# Patient Record
Sex: Female | Born: 1975 | Race: Black or African American | Hispanic: No | Marital: Married | Smoking: Never smoker
Health system: Southern US, Community
[De-identification: ages and names within clinical notes are randomized; demographics above are authoritative.]

## PROBLEM LIST (undated history)

## (undated) DIAGNOSIS — M2550 Pain in unspecified joint: Secondary | ICD-10-CM

## (undated) DIAGNOSIS — R609 Edema, unspecified: Secondary | ICD-10-CM

## (undated) DIAGNOSIS — R0602 Shortness of breath: Secondary | ICD-10-CM

## (undated) DIAGNOSIS — E669 Obesity, unspecified: Secondary | ICD-10-CM

## (undated) DIAGNOSIS — I82409 Acute embolism and thrombosis of unspecified deep veins of unspecified lower extremity: Secondary | ICD-10-CM

## (undated) DIAGNOSIS — R7303 Prediabetes: Secondary | ICD-10-CM

## (undated) DIAGNOSIS — M1990 Unspecified osteoarthritis, unspecified site: Secondary | ICD-10-CM

## (undated) HISTORY — PX: TONSILLECTOMY: SUR1361

## (undated) HISTORY — DX: Shortness of breath: R06.02

## (undated) HISTORY — DX: Edema, unspecified: R60.9

## (undated) HISTORY — DX: Obesity, unspecified: E66.9

## (undated) HISTORY — DX: Prediabetes: R73.03

## (undated) HISTORY — DX: Pain in unspecified joint: M25.50

## (undated) HISTORY — DX: Unspecified osteoarthritis, unspecified site: M19.90

---

## 2006-03-17 ENCOUNTER — Inpatient Hospital Stay (HOSPITAL_COMMUNITY): Admission: AD | Admit: 2006-03-17 | Discharge: 2006-03-17 | Payer: Self-pay | Admitting: Obstetrics and Gynecology

## 2006-03-20 ENCOUNTER — Inpatient Hospital Stay (HOSPITAL_COMMUNITY): Admission: AD | Admit: 2006-03-20 | Discharge: 2006-03-23 | Payer: Self-pay | Admitting: Obstetrics and Gynecology

## 2006-03-24 ENCOUNTER — Encounter: Admission: RE | Admit: 2006-03-24 | Discharge: 2006-04-08 | Payer: Self-pay | Admitting: Obstetrics and Gynecology

## 2006-04-03 ENCOUNTER — Inpatient Hospital Stay (HOSPITAL_COMMUNITY): Admission: AD | Admit: 2006-04-03 | Discharge: 2006-04-03 | Payer: Self-pay | Admitting: Obstetrics and Gynecology

## 2011-08-13 ENCOUNTER — Other Ambulatory Visit (HOSPITAL_COMMUNITY): Payer: BC Managed Care – PPO

## 2011-08-15 ENCOUNTER — Ambulatory Visit (HOSPITAL_COMMUNITY)
Admission: RE | Admit: 2011-08-15 | Payer: BC Managed Care – PPO | Source: Ambulatory Visit | Admitting: Orthopaedic Surgery

## 2011-10-31 ENCOUNTER — Ambulatory Visit
Admission: RE | Admit: 2011-10-31 | Discharge: 2011-10-31 | Disposition: A | Discharge status: Home / Self Care | Payer: BC Managed Care – PPO | Source: Ambulatory Visit | Attending: Sports Medicine | Admitting: Sports Medicine

## 2011-10-31 ENCOUNTER — Other Ambulatory Visit: Payer: Self-pay | Admitting: Sports Medicine

## 2011-10-31 DIAGNOSIS — S8253XA Displaced fracture of medial malleolus of unspecified tibia, initial encounter for closed fracture: Secondary | ICD-10-CM

## 2012-06-22 ENCOUNTER — Ambulatory Visit: Payer: BC Managed Care – PPO | Admitting: Family Medicine

## 2014-01-18 ENCOUNTER — Ambulatory Visit
Admission: RE | Admit: 2014-01-18 | Discharge: 2014-01-18 | Disposition: A | Discharge status: Home / Self Care | Payer: BC Managed Care – PPO | Source: Ambulatory Visit | Attending: Family Medicine | Admitting: Family Medicine

## 2014-01-18 ENCOUNTER — Other Ambulatory Visit: Payer: Self-pay | Admitting: Family Medicine

## 2014-01-18 DIAGNOSIS — M79609 Pain in unspecified limb: Secondary | ICD-10-CM

## 2014-05-20 ENCOUNTER — Other Ambulatory Visit (HOSPITAL_COMMUNITY): Payer: Self-pay | Admitting: Family Medicine

## 2014-05-20 ENCOUNTER — Ambulatory Visit (HOSPITAL_COMMUNITY)
Admission: RE | Admit: 2014-05-20 | Discharge: 2014-05-20 | Disposition: A | Discharge status: Home / Self Care | Payer: BC Managed Care – PPO | Source: Ambulatory Visit | Attending: Family Medicine | Admitting: Family Medicine

## 2014-05-20 DIAGNOSIS — M7989 Other specified soft tissue disorders: Secondary | ICD-10-CM

## 2014-05-20 DIAGNOSIS — M79609 Pain in unspecified limb: Secondary | ICD-10-CM

## 2014-05-20 DIAGNOSIS — M25569 Pain in unspecified knee: Secondary | ICD-10-CM

## 2014-05-20 NOTE — Progress Notes (Signed)
Bilateral lower extremity venous duplex completed.  Right:  No evidence of DVT, superficial thrombosis, or Baker's cyst.  Left: DVT noted in the posterior tibial vein.  No evidence of superficial thrombosis.  No Baker's cyst.   

## 2014-07-18 ENCOUNTER — Encounter: Payer: BC Managed Care – PPO | Attending: Family Medicine | Admitting: Dietician

## 2014-07-18 ENCOUNTER — Encounter: Payer: Self-pay | Admitting: Dietician

## 2014-07-18 DIAGNOSIS — Z6841 Body Mass Index (BMI) 40.0 and over, adult: Secondary | ICD-10-CM | POA: Diagnosis not present

## 2014-07-18 DIAGNOSIS — Z713 Dietary counseling and surveillance: Secondary | ICD-10-CM | POA: Diagnosis not present

## 2014-07-18 NOTE — Patient Instructions (Addendum)
-  Fill up on non-starchy vegetables (any veggie that's not corn, peas, or potatoes)  -Veggies are good raw or cooked, fresh or frozen  -Mindless Eating by Lynnell GrainBrian Wansink  -Make healthy choices at fast food restaurants (refer to handouts)  -Avoid skipping meals  -Keep healthy snacks at work: cheese sticks, fruit, raw veggies with hummus, nuts, granola bars, popcorn, boiled eggs, tortilla chips  -Pre portion snacks in a baggie, bowl, napkin  -Increase physical activity  -Zumba, water aerobics, chair exercises   -Goal: 30 minutes 3x a week  -Vitamins A, D, E, K are fat-soluble -Vitamins B and C are water-soluble

## 2014-07-18 NOTE — Progress Notes (Signed)
  Medical Nutrition Therapy:  Appt start time: 1530 end time:  1630.   Assessment:  Primary concerns today: Catherine Armstrong states that she has always been heavy and it is really starting to slow her down. She says her weight is limiting her quality of life. A blood clot was recently found in her leg. Otherwise she has no comorbidities. However, she has a strong family history of diabetes and stroke. Catherine Armstrong reports she has tried [employer] "on and off" for the last 5-6 years. Also has tried Nut[employer]ystem and "cutting back" on her own. She lives with her 39-year old son and husband. 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 She reports she has been having a lot of fast food recently. A few years ago she went through the bariatric process but cancelled surgery.  Preferred Learning Style:   No preference indicated   Learning Readiness:   Ready   MEDICATIONS: xarelto   DIETARY INTAKE:  Avoided foods include green beans and cooked carrots and cooked broccoli.    24-hr recall:  B ( AM): Whey protein shake with skim or 2% milk  Snk ( AM):   L ( PM): sometimes skips; fast food Snk ( PM): Doritos, peanuts, or [employer]ess snacks D ( PM): Hibachi grill, Barbaritos, or [employer] Snk ( PM):   Beverages: water; soda rarely; sweet tea occasionally  Usual physical activity: none recently  Estimated energy needs: 1800 calories  Progress Towards Goal(s):  In progress.   Nutritional Diagnosis:  [place]-3.3 Overweight/obesity As related to sedentary lifestyle, inappropriate food choices, and excessive energy intake.  As evidenced by BMI 68.    Intervention:  Nutrition counseling provided. Encouraged weight loss of 1-2 lbs per week.  Teaching Method Utilized:  Visual Auditory Hands on  Handouts given during visit include:  Fast Food Options  Bariatric fast food guide  15g CHO + protein snacks  Peds Fast food  handout  MyPlate  Armchair exercises  Barriers to learning/adherence to lifestyle change: schedule  Demonstrated degree of understanding via:  Teach Back   Monitoring/Evaluation:  Dietary intake, exercise, and body weight in 6 week(s).

## 2014-08-30 ENCOUNTER — Ambulatory Visit: Payer: BC Managed Care – PPO | Admitting: Dietician

## 2014-12-30 DIAGNOSIS — I82409 Acute embolism and thrombosis of unspecified deep veins of unspecified lower extremity: Secondary | ICD-10-CM

## 2014-12-30 HISTORY — DX: Acute embolism and thrombosis of unspecified deep veins of unspecified lower extremity: I82.409

## 2016-10-24 DIAGNOSIS — Z139 Encounter for screening, unspecified: Secondary | ICD-10-CM | POA: Diagnosis not present

## 2016-10-24 DIAGNOSIS — H9209 Otalgia, unspecified ear: Secondary | ICD-10-CM | POA: Diagnosis not present

## 2017-09-11 DIAGNOSIS — J069 Acute upper respiratory infection, unspecified: Secondary | ICD-10-CM | POA: Diagnosis not present

## 2017-09-11 DIAGNOSIS — H6693 Otitis media, unspecified, bilateral: Secondary | ICD-10-CM | POA: Diagnosis not present

## 2017-10-29 DIAGNOSIS — Z23 Encounter for immunization: Secondary | ICD-10-CM | POA: Diagnosis not present

## 2017-10-29 DIAGNOSIS — L988 Other specified disorders of the skin and subcutaneous tissue: Secondary | ICD-10-CM | POA: Diagnosis not present

## 2017-10-30 ENCOUNTER — Encounter: Payer: BLUE CROSS/BLUE SHIELD | Attending: Surgery | Admitting: Surgery

## 2017-10-30 DIAGNOSIS — L02221 Furuncle of abdominal wall: Secondary | ICD-10-CM | POA: Insufficient documentation

## 2017-10-30 DIAGNOSIS — M1990 Unspecified osteoarthritis, unspecified site: Secondary | ICD-10-CM | POA: Insufficient documentation

## 2017-10-30 DIAGNOSIS — X58XXXA Exposure to other specified factors, initial encounter: Secondary | ICD-10-CM | POA: Insufficient documentation

## 2017-10-30 DIAGNOSIS — S31104A Unspecified open wound of abdominal wall, left lower quadrant without penetration into peritoneal cavity, initial encounter: Secondary | ICD-10-CM | POA: Diagnosis not present

## 2017-10-30 DIAGNOSIS — Z86718 Personal history of other venous thrombosis and embolism: Secondary | ICD-10-CM | POA: Insufficient documentation

## 2017-10-30 DIAGNOSIS — Z6841 Body Mass Index (BMI) 40.0 and over, adult: Secondary | ICD-10-CM | POA: Insufficient documentation

## 2017-10-30 DIAGNOSIS — L98492 Non-pressure chronic ulcer of skin of other sites with fat layer exposed: Secondary | ICD-10-CM | POA: Diagnosis not present

## 2017-11-01 NOTE — Progress Notes (Signed)
SHAYLEN, NEPHEW M. ([id number]) Visit Report for 10/30/2017 Chief Complaint Document Details Patient Name: Catherine Armstrong, Catherine Armstrong. Date of Service: 10/30/2017 8:15 AM Medical Record Number: [id number] Patient Account Number: [id number] Date of Birth/Sex: 11/16/1976 (41 y.o. Female) Treating RN: Curtis Sites Primary Care Provider: WHITE, CYNTHIA Other Clinician: Referring Provider: WHITE, CYNTHIA Treating Provider/Extender: Rudene Re in Treatment: 0 Information Obtained from: Patient Chief Complaint Patient seen for complaints of Non-Healing Wound to her lower abdomen wall which has been there for about a month Electronic Signature(s) Signed: 10/30/2017 9:03:37 AM By: Evlyn Kanner MD, FACS Entered By: Evlyn Kanner on 10/30/2017 09:03:37 Ailene Ravel. ([id number]) -------------------------------------------------------------------------------- HPI Details Patient Name: Catherine Armstrong. Date of Service: 10/30/2017 8:15 AM Medical Record Number: [id number] Patient Account Number: [id number] Date of Birth/Sex: 07-04-1976 (41 y.o. Female) Treating RN: Curtis Sites Primary Care Provider: WHITE, CYNTHIA Other Clinician: Referring Provider: WHITE, CYNTHIA Treating Provider/Extender: Rudene Re in Treatment: 0 History of Present Illness Location: left lower abdominal wall on her pannus Quality: Patient reports experiencing a dull pain to affected area(s). Severity: Patient states wound are getting worse. Duration: Patient has had the wound for < 4 weeks prior to presenting for treatment Timing: Pain in wound is Intermittent (comes and goes Context: The wound occurred when the patient had what sounds like a for uncle which opened out and drained some pus Modifying Factors: Other treatment(s) tried include:local care with Neosporin ointment Associated Signs and Symptoms: Patient reports having increase discharge. HPI Description: 41 year old morbidly obese patient who has a  history of getting furuncles every now and then under her arms, possibly had a furuncle on her anterior abdomen wall in the region of a large pannus on the left lower quadrant. This drained some fluid or pus and the patient thinks since then it has been frustrating due to not being able to offload it. other than that she has not had any major issues like diabetes and is not a smoker. Electronic Signature(s) Signed: 10/30/2017 9:05:52 AM By: Evlyn Kanner MD, FACS Entered By: Evlyn Kanner on 10/30/2017 09:05:52 Ailene Ravel. ([id number]) -------------------------------------------------------------------------------- Physical Exam Details Patient Name: Catherine Armstrong. Date of Service: 10/30/2017 8:15 AM Medical Record Number: [id number] Patient Account Number: [id number] Date of Birth/Sex: 26-Jul-1976 (41 y.o. Female) Treating RN: Curtis Sites Primary Care Provider: WHITE, CYNTHIA Other Clinician: Referring Provider: WHITE, CYNTHIA Treating Provider/Extender: Rudene Re in Treatment: 0 Constitutional . Pulse regular. Respirations normal and unlabored. Afebrile. . Eyes Nonicteric. Reactive to light. Ears, Nose, Mouth, and Throat Lips, teeth, and gums WNL.Marland Kitchen Moist mucosa without lesions. Neck supple and nontender. No palpable supraclavicular or cervical adenopathy. Normal sized without goiter. Respiratory WNL. No retractions.. Cardiovascular Pedal Pulses WNL. No clubbing, cyanosis or edema. Gastrointestinal (GI) Abdomen without masses or tenderness.. No liver or spleen enlargement or tenderness.. Lymphatic No adneopathy. No adenopathy. No adenopathy. Musculoskeletal Adexa without tenderness or enlargement.. Digits and nails w/o clubbing, cyanosis, infection, petechiae, ischemia, or inflammatory conditions.. Integumentary (Hair, Skin) No suspicious lesions. No crepitus or fluctuance. No peri-wound warmth or erythema. No masses.Marland Kitchen Psychiatric Judgement and insight  Intact.. No evidence of depression, anxiety, or agitation.. Notes ulcerated area on the pannus in the left lower quadrant with significant amount of slough but no erythema or surrounding cellulitis. Very little granulation tissue. No sharp debridement was required today. Electronic Signature(s) Signed: 10/30/2017 9:06:30 AM By: Evlyn Kanner MD, FACS Entered By: Evlyn Kanner on 10/30/2017 09:06:29 Ailene Ravel. ([id number]) -------------------------------------------------------------------------------- Physician Orders Details Patient Name: Catherine Armstrong.  Date of Service: 10/30/2017 8:15 AM Medical Record Number: [id number] Patient Account Number: [id number] Date of Birth/Sex: 08/27/1976 (41 y.o. Female) Treating RN: Curtis Sites Primary Care Provider: WHITE, CYNTHIA Other Clinician: Referring Provider: WHITE, CYNTHIA Treating Provider/Extender: Rudene Re in Treatment: 0 Verbal / Phone Orders: No Diagnosis Coding Wound Cleansing Wound #1 Left Abdomen - Lower Quadrant o Clean wound with Normal Saline. o Cleanse wound with mild soap and water o May Shower, gently pat wound dry prior to applying new dressing. Anesthetic Wound #1 Left Abdomen - Lower Quadrant o Topical Lidocaine 4% cream applied to wound bed prior to debridement Skin Barriers/Peri-Wound Care Wound #1 Left Abdomen - Lower Quadrant o Skin Prep Primary Wound Dressing Wound #1 Left Abdomen - Lower Quadrant o Santyl Ointment Secondary Dressing Wound #1 Left Abdomen - Lower Quadrant o Dry Gauze o Boardered Foam Dressing - or telfa island dressing Dressing Change Frequency Wound #1 Left Abdomen - Lower Quadrant o Change dressing every day. Follow-up Appointments Wound #1 Left Abdomen - Lower Quadrant o Return Appointment in 1 week. Additional Orders / Instructions Wound #1 Left Abdomen - Lower Quadrant o Increase protein intake. o Other: - Please add vitamin A, vitamin C,  multivitamin, and zinc supplements to your diet Medications-please add to medication list. Wound #1 Left Abdomen - Lower Quadrant o Santyl Enzymatic Ointment Dunavan, Randie M. ([id number]) Patient Medications Allergies: No Known Allergies Notifications Medication Indication Start End Santyl 10/30/2017 DOSE topical 250 unit/gram ointment - ointment topical as directed Electronic Signature(s) Signed: 10/30/2017 9:12:13 AM By: Evlyn Kanner MD, FACS Entered By: Evlyn Kanner on 10/30/2017 09:12:13 Studley, [name] ([id number]) -------------------------------------------------------------------------------- Problem List Details Patient Name: CAMAURI, FLEECE. Date of Service: 10/30/2017 8:15 AM Medical Record Number: [id number] Patient Account Number: [id number] Date of Birth/Sex: 07/30/1976 (41 y.o. Female) Treating RN: Curtis Sites Primary Care Provider: WHITE, CYNTHIA Other Clinician: Referring Provider: WHITE, CYNTHIA Treating Provider/Extender: Rudene Re in Treatment: 0 Active Problems ICD-10 Encounter Code Description Active Date Diagnosis S31.104A Unspecified open wound of abdominal wall, left lower quadrant 10/30/2017 Yes without penetration into peritoneal cavity, initial encounter L02.221 Furuncle of abdominal wall 10/30/2017 Yes E66.01 Morbid (severe) obesity due to excess calories 10/30/2017 Yes Inactive Problems Resolved Problems Electronic Signature(s) Signed: 10/30/2017 9:03:12 AM By: Evlyn Kanner MD, FACS Entered By: Evlyn Kanner on 10/30/2017 09:03:11 Ailene Ravel. ([id number]) -------------------------------------------------------------------------------- Progress Note Details Patient Name: MADIE, CAHN. Date of Service: 10/30/2017 8:15 AM Medical Record Number: [id number] Patient Account Number: [id number] Date of Birth/Sex: Sep 04, 1976 (41 y.o. Female) Treating RN: Curtis Sites Primary Care Provider: WHITE, CYNTHIA Other Clinician: Referring  Provider: WHITE, CYNTHIA Treating Provider/Extender: Rudene Re in Treatment: 0 Subjective Chief Complaint Information obtained from Patient Patient seen for complaints of Non-Healing Wound to her lower abdomen wall which has been there for about a month History of Present Illness (HPI) The following HPI elements were documented for the patient's wound: Location: left lower abdominal wall on her pannus Quality: Patient reports experiencing a dull pain to affected area(s). Severity: Patient states wound are getting worse. Duration: Patient has had the wound for < 4 weeks prior to presenting for treatment Timing: Pain in wound is Intermittent (comes and goes Context: The wound occurred when the patient had what sounds like a for uncle which opened out and drained some pus Modifying Factors: Other treatment(s) tried include:local care with Neosporin ointment Associated Signs and Symptoms: Patient reports having increase discharge. 41 year old morbidly obese patient who has a history of getting furuncles  every now and then under her arms, possibly had a furuncle on her anterior abdomen wall in the region of a large pannus on the left lower quadrant. This drained some fluid or pus and the patient thinks since then it has been frustrating due to not being able to offload it. other than that she has not had any major issues like diabetes and is not a smoker. Wound History Patient presents with 1 open wound that has been present for approximately 1 month. Patient has been treating wound in the following manner: neosporin and bandaid. Laboratory tests have not been performed in the last month. Patient reportedly has not tested positive for an antibiotic resistant organism. Patient reportedly has not tested positive for osteomyelitis. Patient reportedly has not had testing performed to evaluate circulation in the legs. Patient History Information obtained from Patient. Allergies No Known  Allergies Family History Cancer - Father, Diabetes - Father,Paternal Grandparents, Kidney Disease - Father, Tuberculosis - Maternal Grandparents, No family history of Heart Disease, Hereditary Spherocytosis, Hypertension, Lung Disease, Seizures, Stroke, Thyroid Problems. Social History Never smoker, Marital Status - Married, Alcohol Use - Moderate, Drug Use - No History, Caffeine Use - Rarely. Medical History Hematologic/Lymphatic Denies history of Anemia, Hemophilia, Human Immunodeficiency Virus, Lymphedema, Sickle Cell Disease Respiratory Dahm, Alissia M. ([id number]) Denies history of Aspiration, Asthma, Chronic Obstructive Pulmonary Disease (COPD), Pneumothorax, Sleep Apnea, Tuberculosis Cardiovascular Patient has history of Deep Vein Thrombosis - 2 years ago Denies history of Angina, Arrhythmia, Congestive Heart Failure, Coronary Artery Disease, Hypertension, Hypotension, Myocardial Infarction, Peripheral Arterial Disease, Peripheral Venous Disease, Phlebitis, Vasculitis Gastrointestinal Denies history of Cirrhosis , Colitis, Crohn s, Hepatitis A, Hepatitis B, Hepatitis C Endocrine Denies history of Type I Diabetes, Type II Diabetes Genitourinary Denies history of End Stage Renal Disease Immunological Patient has history of Raynaud s Denies history of Lupus Erythematosus, Scleroderma Integumentary (Skin) Denies history of History of Burn, History of pressure wounds Musculoskeletal Patient has history of Osteoarthritis Denies history of Gout, Rheumatoid Arthritis, Osteomyelitis Oncologic Denies history of Received Chemotherapy, Received Radiation Review of Systems (ROS) Constitutional Symptoms (General Health) The patient has no complaints or symptoms. Eyes The patient has no complaints or symptoms. Ear/Nose/Mouth/Throat The patient has no complaints or symptoms. Hematologic/Lymphatic The patient has no complaints or symptoms. Respiratory The patient has no complaints  or symptoms. Cardiovascular The patient has no complaints or symptoms. Gastrointestinal The patient has no complaints or symptoms. Endocrine The patient has no complaints or symptoms. Genitourinary The patient has no complaints or symptoms. Immunological The patient has no complaints or symptoms. Integumentary (Skin) The patient has no complaints or symptoms. Musculoskeletal The patient has no complaints or symptoms. Neurologic The patient has no complaints or symptoms. Oncologic The patient has no complaints or symptoms. Psychiatric The patient has no complaints or symptoms. CADY, HAFEN M. ([id number]) Medications amoxicillin 875 mg tablet oral 1 1 tablet oral two times daily Objective Constitutional Pulse regular. Respirations normal and unlabored. Afebrile. Vitals Time Taken: 8:24 AM, Height: 69 in, Source: Measured, Weight: 499 lbs, Source: Measured, BMI: 73.7, Temperature: 98.2 F, Pulse: 104 bpm, Respiratory Rate: 18 breaths/min, Blood Pressure: 128/78 mmHg. Eyes Nonicteric. Reactive to light. Ears, Nose, Mouth, and Throat Lips, teeth, and gums WNL.Marland Kitchen Moist mucosa without lesions. Neck supple and nontender. No palpable supraclavicular or cervical adenopathy. Normal sized without goiter. Respiratory WNL. No retractions.. Cardiovascular Pedal Pulses WNL. No clubbing, cyanosis or edema. Gastrointestinal (GI) Abdomen without masses or tenderness.. No liver or spleen enlargement or tenderness.. Lymphatic No adneopathy.  No adenopathy. No adenopathy. Musculoskeletal Adexa without tenderness or enlargement.. Digits and nails w/o clubbing, cyanosis, infection, petechiae, ischemia, or inflammatory conditions.Marland Kitchen. Psychiatric Judgement and insight Intact.. No evidence of depression, anxiety, or agitation.. General Notes: ulcerated area on the pannus in the left lower quadrant with significant amount of slough but no erythema or surrounding cellulitis. Very little  granulation tissue. No sharp debridement was required today. Integumentary (Hair, Skin) No suspicious lesions. No crepitus or fluctuance. No peri-wound warmth or erythema. No masses.. Wound #1 status is Open. Original cause of wound was Shear/Friction. The wound is located on the Left Abdomen - Lower Quadrant. The wound measures 1.3cm length x 2cm width x 0.2cm depth; 2.042cm^2 area and 0.408cm^3 volume. There is a large amount of purulent drainage noted. The wound margin is flat and intact. There is medium (34-66%) pink granulation within the wound bed. There is a medium (34-66%) amount of necrotic tissue within the wound bed. The periwound skin appearance exhibited: Induration, Maceration. Periwound temperature was noted as No Abnormality. Steward RosURKETT, Sanora M. ([id number]) Assessment Active Problems ICD-10 S31.104A - Unspecified open wound of abdominal wall, left lower quadrant without penetration into peritoneal cavity, initial encounter L02.221 - Furuncle of abdominal wall E66.01 - Morbid (severe) obesity due to excess calories 41 year old morbidly obese patient who possibly had a furuncle on her left lower anterior abdominal wall which has been chronically nonhealing for about a month now, needs chemical debridement and local care and after review have recommended: 1. Santyl ointment locally to be applied daily after washing with soap and water. 2. offloading this area as well as possible 3. Regular was asked to the wound center She has had all questions answered and will be seen back in about 10 days as she is traveling Plan Wound Cleansing: Wound #1 Left Abdomen - Lower Quadrant: Clean wound with Normal Saline. Cleanse wound with mild soap and water May Shower, gently pat wound dry prior to applying new dressing. Anesthetic: Wound #1 Left Abdomen - Lower Quadrant: Topical Lidocaine 4% cream applied to wound bed prior to debridement Skin Barriers/Peri-Wound Care: Wound #1 Left  Abdomen - Lower Quadrant: Skin Prep Primary Wound Dressing: Wound #1 Left Abdomen - Lower Quadrant: Santyl Ointment Secondary Dressing: Wound #1 Left Abdomen - Lower Quadrant: Dry Gauze Boardered Foam Dressing - or telfa island dressing Dressing Change Frequency: Wound #1 Left Abdomen - Lower Quadrant: Change dressing every day. Follow-up Appointments: Wound #1 Left Abdomen - Lower Quadrant: Return Appointment in 1 week. Additional Orders / Instructions: Wound #1 Left Abdomen - Lower Quadrant: Increase protein intake. Other: - Please add vitamin A, vitamin C, multivitamin, and zinc supplements to your diet Scalisi, Consetta M. ([id number]) Medications-please add to medication list.: Wound #1 Left Abdomen - Lower Quadrant: Santyl Enzymatic Ointment The following medication(s) was prescribed: Santyl topical 250 unit/gram ointment ointment topical as directed starting 10/30/2017 41 year old morbidly obese patient who possibly had a furuncle on her left lower anterior abdominal wall which has been chronically nonhealing for about a month now, needs chemical debridement and local care and after review have recommended: 1. Santyl ointment locally to be applied daily after washing with soap and water. 2. offloading this area as well as possible 3. Regular was asked to the wound center She has had all questions answered and will be seen back in about 10 days as she is traveling Psychologist, prison and probation serviceslect[facility] Signature(s) Signed: 10/30/2017 9:12:34 AM By: Evlyn KannerBritto, Kippy Gohman MD, FACS Previous Signature: 10/30/2017 9:11:20 AM Version By: Evlyn KannerBritto, Nizar Cutler MD, FACS Entered  By: Evlyn Kanner on 10/30/2017 09:12:34 Yanik, [name] ([id number]) -------------------------------------------------------------------------------- ROS/PFSH Details Patient Name: EVELYNN, HENCH. Date of Service: 10/30/2017 8:15 AM Medical Record Number: [id number] Patient Account Number: [id number] Date of Birth/Sex: Mar 26, 1976 (41 y.o.  Female) Treating RN: Curtis Sites Primary Care Provider: WHITE, CYNTHIA Other Clinician: Referring Provider: WHITE, CYNTHIA Treating Provider/Extender: Rudene Re in Treatment: 0 Information Obtained From Patient Wound History Do you currently have one or more open woundso Yes How many open wounds do you currently haveo 1 Approximately how long have you had your woundso 1 month How have you been treating your wound(s) until nowo neosporin and bandaid Has your wound(s) ever healed and then re-openedo No Have you had any lab work done in the past montho No Have you tested positive for an antibiotic resistant organism (MRSA, VRE)o No Have you tested positive for osteomyelitis (bone infection)o No Have you had any tests for circulation on your legso No Constitutional Symptoms (General Health) Complaints and Symptoms: No Complaints or Symptoms Eyes Complaints and Symptoms: No Complaints or Symptoms Ear/Nose/Mouth/Throat Complaints and Symptoms: No Complaints or Symptoms Hematologic/Lymphatic Complaints and Symptoms: No Complaints or Symptoms Medical History: Negative for: Anemia; Hemophilia; Human Immunodeficiency Virus; Lymphedema; Sickle Cell Disease Respiratory Complaints and Symptoms: No Complaints or Symptoms Medical History: Negative for: Aspiration; Asthma; Chronic Obstructive Pulmonary Disease (COPD); Pneumothorax; Sleep Apnea; Tuberculosis Cardiovascular Complaints and Symptoms: No Complaints or Symptoms Zollner, Christelle M. ([id number]) Medical History: Positive for: Deep Vein Thrombosis - 2 years ago Negative for: Angina; Arrhythmia; Congestive Heart Failure; Coronary Artery Disease; Hypertension; Hypotension; Myocardial Infarction; Peripheral Arterial Disease; Peripheral Venous Disease; Phlebitis; Vasculitis Gastrointestinal Complaints and Symptoms: No Complaints or Symptoms Medical History: Negative for: Cirrhosis ; Colitis; Crohnos; Hepatitis A;  Hepatitis B; Hepatitis C Endocrine Complaints and Symptoms: No Complaints or Symptoms Medical History: Negative for: Type I Diabetes; Type II Diabetes Genitourinary Complaints and Symptoms: No Complaints or Symptoms Medical History: Negative for: End Stage Renal Disease Immunological Complaints and Symptoms: No Complaints or Symptoms Medical History: Positive for: Raynaudos Negative for: Lupus Erythematosus; Scleroderma Integumentary (Skin) Complaints and Symptoms: No Complaints or Symptoms Medical History: Negative for: History of Burn; History of pressure wounds Musculoskeletal Complaints and Symptoms: No Complaints or Symptoms Medical History: Positive for: Osteoarthritis Negative for: Gout; Rheumatoid Arthritis; Osteomyelitis Neurologic Complaints and Symptoms: No Complaints or Symptoms Register, Jayliani M. ([id number]) Oncologic Complaints and Symptoms: No Complaints or Symptoms Medical History: Negative for: Received Chemotherapy; Received Radiation Psychiatric Complaints and Symptoms: No Complaints or Symptoms Immunizations Pneumococcal Vaccine: Received Pneumococcal Vaccination: No Tetanus Vaccine: Last tetanus shot: 10/29/2017 Immunization Notes: up to date Implantable Devices Family and Social History Cancer: Yes - Father; Diabetes: Yes - Father,Paternal Grandparents; Heart Disease: No; Hereditary Spherocytosis: No; Hypertension: No; Kidney Disease: Yes - Father; Lung Disease: No; Seizures: No; Stroke: No; Thyroid Problems: No; Tuberculosis: Yes - Maternal Grandparents; Never smoker; Marital Status - Married; Alcohol Use: Moderate; Drug Use: No History; Caffeine Use: Rarely; Financial Concerns: No; Food, Clothing or Shelter Needs: No; Support System Lacking: No; Transportation Concerns: No; Advanced Directives: No; Patient does not want information on Advanced Directives Physician Affirmation I have reviewed and agree with the above  information. Electronic Signature(s) Signed: 10/30/2017 4:34:23 PM By: Evlyn Kanner MD, FACS Signed: 10/30/2017 4:49:35 PM By: Curtis Sites Entered By: Evlyn Kanner on 10/30/2017 09:01:35 Ailene Ravel. ([id number]) -------------------------------------------------------------------------------- SuperBill Details Patient Name: ELIORA, NIENHUIS. Date of Service: 10/30/2017 Medical Record Number: [id number] Patient Account Number: [id number] Date of Birth/Sex: 12/25/1976 (41 y.o.  Female) Treating RN: Curtis Sites Primary Care Provider: WHITE, CYNTHIA Other Clinician: Referring Provider: WHITE, CYNTHIA Treating Provider/Extender: Rudene Re in Treatment: 0 Diagnosis Coding ICD-10 Codes Code Description Unspecified open wound of abdominal wall, left lower quadrant without penetration into peritoneal cavity, S31.104A initial encounter L02.221 Furuncle of abdominal wall E66.01 Morbid (severe) obesity due to excess calories Facility Procedures CPT4 Code: 16109604 Description: 99214 - WOUND CARE VISIT-LEV 4 EST PT Modifier: Quantity: 1 Physician Procedures CPT4: Description Modifier Quantity Code 5409811 99204 - WC PHYS LEVEL 4 - NEW PT 1 ICD-10 Diagnosis Description S31.104A Unspecified open wound of abdominal wall, left lower quadrant without penetration into peritoneal cavity, initial encounter L02.221  Furuncle of abdominal wall E66.01 Morbid (severe) obesity due to excess calories Electronic Signature(s) Signed: 10/30/2017 9:20:48 AM By: Curtis Sites Signed: 10/30/2017 4:34:23 PM By: Evlyn Kanner MD, FACS Previous Signature: 10/30/2017 9:13:00 AM Version By: Evlyn Kanner MD, FACS Entered By: Curtis Sites on 10/30/2017 09:20:47

## 2017-11-01 NOTE — Progress Notes (Signed)
Steward RosURKETT, Malaysha M. ([id number]) Visit Report for 10/30/2017 Abuse/Suicide Risk Screen Details Patient Name: Catherine Armstrong, [name] Date of Service: 10/30/2017 8:15 AM Medical Record Number: [id number] Patient Account Number: [id number] Date of Birth/Sex: 1976/04/07 (41 y.o. Female) Treating RN: Curtis Sitesorthy, Joanna Primary Care Bucky Grigg: WHITE, CYNTHIA Other Clinician: Referring Yechiel Erny: WHITE, CYNTHIA Treating Santiaga Butzin/Extender: [name], Errol Weeks in Treatment: 0 Abuse/Suicide Risk Screen Items Answer ABUSE/SUICIDE RISK SCREEN: Has anyone close to you tried to hurt or harm you recentlyo No Do you feel uncomfortable with anyone in your familyo No Has anyone forced you do things that you didnot want to doo No Do you have any thoughts of harming yourselfo No Patient displays signs or symptoms of abuse and/or neglect. No Electronic Signature(s) Signed: 10/30/2017 4:49:35 PM By: Curtis Sitesorthy, Joanna Entered By: Curtis Sitesorthy, Joanna on 10/30/2017 08:22:44 Catherine Armstrong, [name] ([id number]) -------------------------------------------------------------------------------- Activities of Daily Living Details Patient Name: Catherine Armstrong, [name] Date of Service: 10/30/2017 8:15 AM Medical Record Number: [id number] Patient Account Number: [id number] Date of Birth/Sex: 1976/04/07 (41 y.o. Female) Treating RN: Curtis Sitesorthy, Joanna Primary Care Latiana Tomei: WHITE, CYNTHIA Other Clinician: Referring Aidan Moten: WHITE, CYNTHIA Treating Hymie Gorr/Extender: [name], Errol Weeks in Treatment: 0 Activities of Daily Living Items Answer Activities of Daily Living (Please select one for each item) Drive Automobile Completely Able Take Medications Completely Able Use Telephone Completely Able Care for Appearance Completely Able Use Toilet Completely Able Bath / Shower Completely Able Dress Self Completely Able Feed Self Completely Able Walk Completely Able Get In / Out Bed Completely Able Housework Completely Able Prepare Meals Completely  Able Handle Money Completely Able Shop for Self Completely Able Electronic Signature(s) Signed: 10/30/2017 4:49:35 PM By: Curtis Sitesorthy, Joanna Entered By: Curtis Sitesorthy, Joanna on 10/30/2017 08:23:02 Catherine Armstrong, [name] ([id number]) -------------------------------------------------------------------------------- Education Assessment Details Patient Name: Catherine Armstrong, [name] Date of Service: 10/30/2017 8:15 AM Medical Record Number: [id number] Patient Account Number: [id number] Date of Birth/Sex: 1976/04/07 (41 y.o. Female) Treating RN: Curtis Sitesorthy, Joanna Primary Care Yetta Marceaux: WHITE, CYNTHIA Other Clinician: Referring Ceclia Koker: WHITE, CYNTHIA Treating Hanaa Payes/Extender: [name], Errol Weeks in Treatment: 0 Primary Learner Assessed: Patient Learning Preferences/Education Level/Primary Language Learning Preference: Explanation, Demonstration, Printed Material Highest Education Level: College or Above Preferred Language: English Cognitive Barrier Assessment/Beliefs Language Barrier: No Translator Needed: No Memory Deficit: No Emotional Barrier: No Cultural/Religious Beliefs Affecting Medical Care: No Physical Barrier Assessment Impaired Vision: No Impaired Hearing: No Decreased Hand dexterity: No Knowledge/Comprehension Assessment Knowledge Level: Medium Comprehension Level: Medium Ability to understand written Medium instructions: Ability to understand verbal Medium instructions: Motivation Assessment Anxiety Level: Calm Cooperation: Cooperative Education Importance: Acknowledges Need Interest in Health Problems: Asks Questions Perception: Coherent Willingness to Engage in Self- Medium Management Activities: Readiness to Engage in Self- Medium Management Activities: Electronic Signature(s) Signed: 10/30/2017 4:49:35 PM By: Curtis Sitesorthy, Joanna Entered By: Curtis Sitesorthy, Joanna on 10/30/2017 08:23:51 [name], Catherine BaptistNIKKI M.  ([id number]) -------------------------------------------------------------------------------- Fall Risk Assessment Details Patient Name: Catherine Armstrong, [name] Date of Service: 10/30/2017 8:15 AM Medical Record Number: [id number] Patient Account Number: [id number] Date of Birth/Sex: 1976/04/07 (41 y.o. Female) Treating RN: Curtis Sitesorthy, Joanna Primary Care Marykate Heuberger: WHITE, CYNTHIA Other Clinician: Referring Zakariyah Freimark: WHITE, CYNTHIA Treating Ahmarion Saraceno/Extender: [name], Errol Weeks in Treatment: 0 Fall Risk Assessment Items Have you had 2 or more falls in the last 12 monthso 0 No Have you had any fall that resulted in injury in the last 12 monthso 0 No FALL RISK ASSESSMENT: History of falling - immediate or within 3 months 0 No Secondary diagnosis 0 No Ambulatory aid None/bed rest/wheelchair/nurse 0 Yes Crutches/cane/walker 0 No Furniture 0 No  IV Access/Saline Lock 0 No Gait/Training Normal/bed rest/immobile 0 Yes Weak 0 No Impaired 0 No Mental Status Oriented to own ability 0 Yes Electronic Signature(s) Signed: 10/30/2017 4:49:35 PM By: Curtis Sites Entered By: Curtis Sites on 10/30/2017 08:24:13 Mowrer, Catherine Armstrong. ([id number]) -------------------------------------------------------------------------------- Nutrition Risk Assessment Details Patient Name: Catherine Armstrong, [name] Date of Service: 10/30/2017 8:15 AM Medical Record Number: [id number] Patient Account Number: [id number] Date of Birth/Sex: 1976/04/02 (41 y.o. Female) Treating RN: Curtis Sites Primary Care Lexia Vandevender: WHITE, CYNTHIA Other Clinician: Referring Abimbola Aki: WHITE, CYNTHIA Treating Reshard Guillet/Extender: Rudene Re in Treatment: 0 Height (in): Weight (lbs): Body Mass Index (BMI): Nutrition Risk Assessment Items NUTRITION RISK SCREEN: I have an illness or condition that made me change the kind and/or amount of 0 No food I eat I eat fewer than two meals per day 0 No I eat few fruits and vegetables, or milk products  0 No I have three or more drinks of beer, liquor or wine almost every day 0 No I have tooth or mouth problems that make it hard for me to eat 0 No I don't always have enough money to buy the food I need 0 No I eat alone most of the time 0 No I take three or more different prescribed or over-the-counter drugs a day 0 No Without wanting to, I have lost or gained 10 pounds in the last six months 0 No I am not always physically able to shop, cook and/or feed myself 0 No Nutrition Protocols Good Risk Protocol 0 No interventions needed Moderate Risk Protocol Electronic Signature(s) Signed: 10/30/2017 4:49:35 PM By: Curtis Sites Entered By: Curtis Sites on 10/30/2017 08:24:41

## 2017-11-03 NOTE — Progress Notes (Signed)
COLA, GANE M. ([id number]) Visit Report for 10/30/2017 Allergy List Details Patient Name: Catherine Armstrong, Catherine Armstrong. Date of Service: 10/30/2017 8:15 AM Medical Record Number: [id number] Patient Account Number: [id number] Date of Birth/Sex: June 19, 1976 (41 y.o. Female) Treating RN: Curtis Sites Primary Care Cheyne Boulden: WHITE, CYNTHIA Other Clinician: Referring Jayel Scaduto: WHITE, CYNTHIA Treating Jaxson Keener/Extender: Rudene Re in Treatment: 0 Allergies Active Allergies No Known Allergies Allergy Notes Electronic Signature(s) Signed: 10/30/2017 4:49:35 PM By: Curtis Sites Entered By: Curtis Sites on 10/30/2017 08:25:41 Margulies, [name] ([id number]) -------------------------------------------------------------------------------- Arrival Information Details Patient Name: Catherine Armstrong. Date of Service: 10/30/2017 8:15 AM Medical Record Number: [id number] Patient Account Number: [id number] Date of Birth/Sex: Mar 01, 1976 (42 y.o. Female) Treating RN: Curtis Sites Primary Care Alyus Mofield: WHITE, CYNTHIA Other Clinician: Referring Januel Doolan: WHITE, CYNTHIA Treating Joyce Leckey/Extender: Rudene Re in Treatment: 0 Visit Information Patient Arrived: Ambulatory Arrival Time: 08:21 Accompanied By: self Transfer Assistance: None Patient Identification Verified: Yes Secondary Verification Process Completed: Yes Electronic Signature(s) Signed: 10/30/2017 4:49:35 PM By: Curtis Sites Entered By: Curtis Sites on 10/30/2017 08:22:17 Ailene Ravel. ([id number]) -------------------------------------------------------------------------------- Clinic Level of Care Assessment Details Patient Name: Catherine Armstrong. Date of Service: 10/30/2017 8:15 AM Medical Record Number: [id number] Patient Account Number: [id number] Date of Birth/Sex: 1976-11-24 (41 y.o. Female) Treating RN: Curtis Sites Primary Care Mickey Hebel: WHITE, CYNTHIA Other Clinician: Referring Jaquane Boughner: WHITE,  CYNTHIA Treating Yasira Engelson/Extender: Rudene Re in Treatment: 0 Clinic Level of Care Assessment Items TOOL 2 Quantity Score []  - Use when only an EandM is performed on the INITIAL visit 0 ASSESSMENTS - Nursing Assessment / Reassessment X - General Physical Exam (combine w/ comprehensive assessment (listed just below) when 1 20 performed on new pt. evals) X- 1 25 Comprehensive Assessment (HX, ROS, Risk Assessments, Wounds Hx, etc.) ASSESSMENTS - Wound and Skin Assessment / Reassessment X - Simple Wound Assessment / Reassessment - one wound 1 5 []  - 0 Complex Wound Assessment / Reassessment - multiple wounds []  - 0 Dermatologic / Skin Assessment (not related to wound area) ASSESSMENTS - Ostomy and/or Continence Assessment and Care []  - Incontinence Assessment and Management 0 []  - 0 Ostomy Care Assessment and Management (repouching, etc.) PROCESS - Coordination of Care X - Simple Patient / Family Education for ongoing care 1 15 []  - 0 Complex (extensive) Patient / Family Education for ongoing care X- 1 10 Staff obtains Chiropractor, Records, Test Results / Process Orders []  - 0 Staff telephones HHA, Nursing Homes / Clarify orders / etc []  - 0 Routine Transfer to another Facility (non-emergent condition) []  - 0 Routine Hospital Admission (non-emergent condition) X- 1 15 New Admissions / Manufacturing engineer / Ordering NPWT, Apligraf, etc. []  - 0 Emergency Hospital Admission (emergent condition) X- 1 10 Simple Discharge Coordination []  - 0 Complex (extensive) Discharge Coordination PROCESS - Special Needs []  - Pediatric / Minor Patient Management 0 []  - 0 Isolation Patient Management Obrecht, Tiombe M. ([id number]) []  - 0 Hearing / Language / Visual special needs []  - 0 Assessment of Community assistance (transportation, D/C planning, etc.) []  - 0 Additional assistance / Altered mentation []  - 0 Support Surface(s) Assessment (bed, cushion, seat,  etc.) INTERVENTIONS - Wound Cleansing / Measurement X - Wound Imaging (photographs - any number of wounds) 1 5 []  - 0 Wound Tracing (instead of photographs) X- 1 5 Simple Wound Measurement - one wound []  - 0 Complex Wound Measurement - multiple wounds X- 1 5 Simple Wound Cleansing - one wound []  - 0 Complex Wound Cleansing - multiple  wounds INTERVENTIONS - Wound Dressings X - Small Wound Dressing one or multiple wounds 1 10 []  - 0 Medium Wound Dressing one or multiple wounds []  - 0 Large Wound Dressing one or multiple wounds []  - 0 Application of Medications - injection INTERVENTIONS - Miscellaneous []  - External ear exam 0 []  - 0 Specimen Collection (cultures, biopsies, blood, body fluids, etc.) []  - 0 Specimen(s) / Culture(s) sent or taken to Lab for analysis []  - 0 Patient Transfer (multiple staff / Nurse, adult / Similar devices) []  - 0 Simple Staple / Suture removal (25 or less) []  - 0 Complex Staple / Suture removal (26 or more) []  - 0 Hypo / Hyperglycemic Management (close monitor of Blood Glucose) []  - 0 Ankle / Brachial Index (ABI) - do not check if billed separately Has the patient been seen at the hospital within the last three years: Yes Total Score: 125 Level Of Care: New/Established - Level 4 Electronic Signature(s) Signed: 10/30/2017 4:49:35 PM By: Curtis Sites Entered By: Curtis Sites on 10/30/2017 09:20:38 Ailene Ravel. ([id number]) -------------------------------------------------------------------------------- Encounter Discharge Information Details Patient Name: Catherine Armstrong. Date of Service: 10/30/2017 8:15 AM Medical Record Number: [id number] Patient Account Number: [id number] Date of Birth/Sex: [birth year] (41 y.o. Female) Treating RN: Curtis Sites Primary Care Mileigh Tilley: WHITE, CYNTHIA Other Clinician: Referring Mithra Spano: WHITE, CYNTHIA Treating Cire Clute/Extender: Rudene Re in Treatment: 0 Encounter Discharge Information  Items Discharge Pain Level: 0 Discharge Condition: Stable Ambulatory Status: Ambulatory Discharge Destination: Home Transportation: Private Auto Accompanied By: self Schedule Follow-up Appointment: Yes Medication Reconciliation completed and No provided to Patient/Care Tavian Callander: Provided on Clinical Summary of Care: 10/30/2017 Form Type Recipient Paper Patient NP Electronic Signature(s) Signed: 11/03/2017 10:06:49 AM By: Gwenlyn Perking Entered By: Gwenlyn Perking on 10/30/2017 09:05:43 Nikolic, Lowella Bandy M. ([id number]) -------------------------------------------------------------------------------- Lower Extremity Assessment Details Patient Name: ANJANI, FEUERBORN. Date of Service: 10/30/2017 8:15 AM Medical Record Number: [id number] Patient Account Number: [id number] Date of Birth/Sex: [birth year] (41 y.o. Female) Treating RN: Curtis Sites Primary Care Harvir Patry: WHITE, CYNTHIA Other Clinician: Referring Jedidiah Demartini: WHITE, CYNTHIA Treating Nickholas Goldston/Extender: Rudene Re in Treatment: 0 Electronic Signature(s) Signed: 10/30/2017 4:49:35 PM By: Curtis Sites Entered By: Curtis Sites on 10/30/2017 08:38:22 Ailene Ravel. ([id number]) -------------------------------------------------------------------------------- Multi Wound Chart Details Patient Name: MATILDA, FLEIG. Date of Service: 10/30/2017 8:15 AM Medical Record Number: [id number] Patient Account Number: [id number] Date of Birth/Sex: Jul 18, 1976 (41 y.o. Female) Treating RN: Curtis Sites Primary Care Tafari Humiston: WHITE, CYNTHIA Other Clinician: Referring Generoso Cropper: WHITE, CYNTHIA Treating Rowyn Spilde/Extender: Rudene Re in Treatment: 0 Vital Signs Height(in): 69 Pulse(bpm): 104 Weight(lbs): 499 Blood Pressure(mmHg): 128/78 Body Mass Index(BMI): 74 Temperature(F): 98.2 Respiratory Rate 18 (breaths/min): Photos: [1:No Photos] [N/A:N/A] Wound Location: [1:Left Abdomen - Lower Quadrant] [N/A:N/A] Wounding  Event: [1:Shear/Friction] [N/A:N/A] Primary Etiology: [1:Pressure Ulcer] [N/A:N/A] Comorbid History: [1:Deep Vein Thrombosis, Raynaudos, Osteoarthritis] [N/A:N/A] Date Acquired: [1:09/29/2017] [N/A:N/A] Weeks of Treatment: [1:0] [N/A:N/A] Wound Status: [1:Open] [N/A:N/A] Measurements L x W x D [1:1.3x2x0.2] [N/A:N/A] (cm) Area (cm) : [1:2.042] [N/A:N/A] Volume (cm) : [1:0.408] [N/A:N/A] Classification: [1:Category/Stage III] [N/A:N/A] Exudate Amount: [1:Large] [N/A:N/A] Exudate Type: [1:Purulent] [N/A:N/A] Exudate Color: [1:yellow, brown, green] [N/A:N/A] Wound Margin: [1:Flat and Intact] [N/A:N/A] Granulation Amount: [1:Medium (34-66%)] [N/A:N/A] Granulation Quality: [1:Pink] [N/A:N/A] Necrotic Amount: [1:Medium (34-66%)] [N/A:N/A] Periwound Skin Texture: [1:Induration: Yes] [N/A:N/A] Periwound Skin Moisture: [1:Maceration: Yes] [N/A:N/A] Periwound Skin Color: [1:No Abnormalities Noted] [N/A:N/A] Temperature: [1:No Abnormality] [N/A:N/A] Tenderness on Palpation: [1:No] [N/A:N/A] Wound Preparation: [1:Ulcer Cleansing: Rinsed/Irrigated with Saline] [N/A:N/A] Topical Anesthetic Applied: Other: lidocaine 4% Treatment Notes  Electronic Signature(s) Standage, Lowella BandyNIKKI M. ([id number]) Signed: 10/30/2017 9:03:16 AM By: Evlyn KannerBritto, Errol MD, FACS Entered By: Evlyn KannerBritto, Errol on 10/30/2017 09:03:16 Ailene RavelPURKETT, Batool M. ([id number]) -------------------------------------------------------------------------------- Multi-Disciplinary Care Plan Details Patient Name: Ailene RavelURKETT, Keisha M. Date of Service: 10/30/2017 8:15 AM Medical Record Number: [id number] Patient Account Number: [id number] Date of Birth/Sex: [birth year] (41 y.o. Female) Treating RN: Curtis Sitesorthy, Joanna Primary Care Acelin Ferdig: WHITE, CYNTHIA Other Clinician: Referring Jettson Crable: WHITE, CYNTHIA Treating Melaya Hoselton/Extender: [name], Errol Weeks in Treatment: 0 Active Inactive ` Orientation to the Wound Care Program Nursing Diagnoses: Knowledge  deficit related to the wound healing center program Goals: Patient/caregiver will verbalize understanding of the Wound Healing Center Program Date Initiated: 10/30/2017 Target Resolution Date: 01/23/2018 Goal Status: Active Interventions: Provide education on orientation to the wound center Notes: ` Pressure Nursing Diagnoses: Potential for impaired tissue integrity related to pressure, friction, moisture, and shear Goals: Patient will remain free from development of additional pressure ulcers Date Initiated: 10/30/2017 Target Resolution Date: 02/06/2018 Goal Status: Active Interventions: Provide education on pressure ulcers Notes: ` Wound/Skin Impairment Nursing Diagnoses: Impaired tissue integrity Goals: Ulcer/skin breakdown will heal within 14 weeks Date Initiated: 10/30/2017 Target Resolution Date: 02/07/2018 Goal Status: Active Interventions: Ailene RavelURKETT, Bryanda M. ([id number]) Assess patient/caregiver ability to obtain necessary supplies Assess patient/caregiver ability to perform ulcer/skin care regimen upon admission and as needed Assess ulceration(s) every visit Notes: Electronic Signature(s) Signed: 10/30/2017 4:49:35 PM By: Curtis Sitesorthy, Joanna Entered By: Curtis Sitesorthy, Joanna on 10/30/2017 08:49:17 Scarantino, [name] ([id number]) -------------------------------------------------------------------------------- Pain Assessment Details Patient Name: Ailene RavelURKETT, Lalisa M. Date of Service: 10/30/2017 8:15 AM Medical Record Number: [id number] Patient Account Number: [id number] Date of Birth/Sex: [birth year] (41 y.o. Female) Treating RN: Curtis Sitesorthy, Joanna Primary Care Yazmyn Valbuena: WHITE, CYNTHIA Other Clinician: Referring Karrisa Didio: WHITE, CYNTHIA Treating Ocie Tino/Extender: [name], Errol Weeks in Treatment: 0 Active Problems Location of Pain Severity and Description of Pain Patient Has Paino No Site Locations Pain Management and Medication Current Pain Management: Electronic Signature(s) Signed:  10/30/2017 4:49:35 PM By: Curtis Sitesorthy, Joanna Entered By: Curtis Sitesorthy, Joanna on 10/30/2017 08:22:29 Ailene RavelPURKETT, Shahrzad M. ([id number]) -------------------------------------------------------------------------------- Patient/Caregiver Education Details Patient Name: Ailene RavelURKETT, Makyna M. Date of Service: 10/30/2017 8:15 AM Medical Record Number: [id number] Patient Account Number: [id number] Date of Birth/Gender: [birth year] (41 y.o. Female) Treating RN: Curtis Sitesorthy, Joanna Primary Care Physician: WHITE, CYNTHIA Other Clinician: Referring Physician: WHITE, CYNTHIA Treating Physician/Extender: [name], Errol Weeks in Treatment: 0 Education Assessment Education Provided To: Patient Education Topics Provided Wound/Skin Impairment: Handouts: Other: wound care as ordered Methods: Demonstration, Explain/Verbal Responses: State content correctly Electronic Signature(s) Signed: 10/30/2017 4:49:35 PM By: Curtis Sitesorthy, Joanna Entered By: Curtis Sitesorthy, Joanna on 10/30/2017 08:52:19 Tener, [name] ([id number]) -------------------------------------------------------------------------------- Wound Assessment Details Patient Name: Ailene RavelURKETT, Orianna M. Date of Service: 10/30/2017 8:15 AM Medical Record Number: [id number] Patient Account Number: [id number] Date of Birth/Sex: [birth year] (41 y.o. Female) Treating RN: Curtis Sitesorthy, Joanna Primary Care Asharia Lotter: WHITE, CYNTHIA Other Clinician: Referring Ezrah Dembeck: WHITE, CYNTHIA Treating Camrie Stock/Extender: [name], Errol Weeks in Treatment: 0 Wound Status Wound Number: 1 Primary Pressure Ulcer Etiology: Wound Location: Left Abdomen - Lower Quadrant Wound Status: Open Wounding Event: Shear/Friction Comorbid Deep Vein Thrombosis, Raynaudos, Date Acquired: 09/29/2017 History: Osteoarthritis Weeks Of Treatment: 0 Clustered Wound: No Photos Photo Uploaded By: Curtis Sitesorthy, Joanna on 10/30/2017 10:29:56 Wound Measurements Length: (cm) Width: (cm) Depth: (cm) Area: (cm) Volume: (cm) 1.3 %  Reduction in Area: 2 % Reduction in Volume: 0.2 2.042 0.408 Wound Description Classification: Category/Stage III Wound Margin: Flat and Intact Exudate Amount: Large Exudate Type: Purulent Exudate Color: yellow, brown, green Foul Odor After Cleansing: No Slough/Fibrino  Yes Wound Bed Granulation Amount: Medium (34-66%) Granulation Quality: Pink Necrotic Amount: Medium (34-66%) Periwound Skin Texture Texture Color No Abnormalities Noted: No No Abnormalities Noted: No Induration: Yes Temperature / Pain Moisture Temperature: No Abnormality Brun, [place] M. ([id number]) No Abnormalities Noted: No Maceration: Yes Wound Preparation Ulcer Cleansing: Rinsed/Irrigated with Saline Topical Anesthetic Applied: Other: lidocaine 4%, Treatment Notes Wound #1 (Left Abdomen - Lower Quadrant) 1. Cleansed with: Clean wound with Normal Saline 2. Anesthetic Topical Lidocaine 4% cream to wound bed prior to debridement 4. Dressing Applied: Santyl Ointment 5. Secondary Dressing Applied Bordered Foam Dressing Dry Gauze Electronic Signature(s) Signed: 10/30/2017 4:49:35 PM By: Curtis Sites Entered By: Curtis Sites on 10/30/2017 08:38:10 Gibbons, [name] ([id number]) -------------------------------------------------------------------------------- Vitals Details Patient Name: SHANTAE, VANTOL. Date of Service: 10/30/2017 8:15 AM Medical Record Number: [id number] Patient Account Number: [id number] Date of Birth/Sex: Oct 07, 1976 (41 y.o. Female) Treating RN: Curtis Sites Primary Care Necola Bluestein: WHITE, CYNTHIA Other Clinician: Referring Pascuala Klutts: WHITE, CYNTHIA Treating Kaelum Kissick/Extender: Rudene Re in Treatment: 0 Vital Signs Time Taken: 08:24 Temperature (F): 98.2 Height (in): 69 Pulse (bpm): 104 Source: Measured Respiratory Rate (breaths/min): 18 Weight (lbs): 499 Blood Pressure (mmHg): 128/78 Source: Measured Reference Range: 80 - 120 mg / dl Body Mass Index (BMI):  73.7 Electronic Signature(s) Signed: 10/30/2017 4:49:35 PM By: Curtis Sites Entered By: Curtis Sites on 10/30/2017 08:25:21

## 2017-11-10 ENCOUNTER — Ambulatory Visit: Payer: BLUE CROSS/BLUE SHIELD | Admitting: Surgery

## 2017-11-14 ENCOUNTER — Encounter: Payer: BLUE CROSS/BLUE SHIELD | Admitting: Surgery

## 2017-11-14 DIAGNOSIS — S31104A Unspecified open wound of abdominal wall, left lower quadrant without penetration into peritoneal cavity, initial encounter: Secondary | ICD-10-CM | POA: Diagnosis not present

## 2017-11-14 DIAGNOSIS — X58XXXA Exposure to other specified factors, initial encounter: Secondary | ICD-10-CM | POA: Diagnosis not present

## 2017-11-14 DIAGNOSIS — Z86718 Personal history of other venous thrombosis and embolism: Secondary | ICD-10-CM | POA: Diagnosis not present

## 2017-11-14 DIAGNOSIS — Z6841 Body Mass Index (BMI) 40.0 and over, adult: Secondary | ICD-10-CM | POA: Diagnosis not present

## 2017-11-14 DIAGNOSIS — M1990 Unspecified osteoarthritis, unspecified site: Secondary | ICD-10-CM | POA: Diagnosis not present

## 2017-11-14 DIAGNOSIS — L02221 Furuncle of abdominal wall: Secondary | ICD-10-CM | POA: Diagnosis not present

## 2017-11-16 NOTE — Progress Notes (Addendum)
Steward RosURKETT, [name] ([id number]) Visit Report for 11/14/2017 Arrival Information Details Patient Name: Catherine Armstrong, [name] Date of Service: 11/14/2017 9:15 AM Medical Record Number: [id number] Patient Account Number: [id number] Date of Birth/Sex: February 27, 1976 (41 y.o. Female) Treating RN: Catherine Armstrong Primary Care Catherine Armstrong: Catherine Armstrong Other Clinician: Referring Catherine Armstrong: Catherine Armstrong Treating Frederico Gerling/Extender: Catherine Armstrong in Treatment: 2 Visit Information History Since Last Visit Added or deleted any medications: No Patient Arrived: Ambulatory Any new allergies or adverse reactions: No Arrival Time: 09:21 Had a fall or experienced change in No Accompanied By: self activities of daily living that may affect Transfer Assistance: None risk of falls: Patient Identification Verified: Yes Signs or symptoms of abuse/neglect since last visito No Secondary Verification Process Completed: Yes Hospitalized since last visit: No Has Dressing in Place as Prescribed: Yes Pain Present Now: No Electronic Signature(s) Signed: 11/14/2017 5:02:13 PM By: Catherine Armstrong Entered By: Catherine Armstrong on 11/14/2017 09:22:12 Catherine Armstrong, [name] ([id number]) -------------------------------------------------------------------------------- Clinic Level of Care Assessment Details Patient Name: Catherine Armstrong, [name] Date of Service: 11/14/2017 9:15 AM Medical Record Number: [id number] Patient Account Number: [id number] Date of Birth/Sex: February 27, 1976 (41 y.o. Female) Treating RN: Catherine Armstrong Primary Care Karuna Balducci: Catherine Armstrong Other Clinician: Referring Casha Estupinan: Catherine Armstrong Treating Catherine Armstrong/Extender: Catherine Armstrong in Treatment: 2 Clinic Level of Care Assessment Items TOOL 4 Quantity Score []  - Use when only an EandM is performed on FOLLOW-UP visit 0 ASSESSMENTS - Nursing Assessment / Reassessment X - Reassessment of Co-morbidities (includes updates in patient status) 1 10 X- 1  5 Reassessment of Adherence to Treatment Plan ASSESSMENTS - Wound and Skin Assessment / Reassessment X - Simple Wound Assessment / Reassessment - one wound 1 5 []  - 0 Complex Wound Assessment / Reassessment - multiple wounds []  - 0 Dermatologic / Skin Assessment (not related to wound area) ASSESSMENTS - Focused Assessment []  - Circumferential Edema Measurements - multi extremities 0 []  - 0 Nutritional Assessment / Counseling / Intervention []  - 0 Lower Extremity Assessment (monofilament, tuning fork, pulses) []  - 0 Peripheral Arterial Disease Assessment (using hand held doppler) ASSESSMENTS - Ostomy and/or Continence Assessment and Care []  - Incontinence Assessment and Management 0 []  - 0 Ostomy Care Assessment and Management (repouching, etc.) PROCESS - Coordination of Care X - Simple Patient / Family Education for ongoing care 1 15 []  - 0 Complex (extensive) Patient / Family Education for ongoing care []  - 0 Staff obtains ChiropractorConsents, Records, Test Results / Process Orders []  - 0 Staff telephones HHA, Nursing Homes / Clarify orders / etc []  - 0 Routine Transfer to another Facility (non-emergent condition) []  - 0 Routine Hospital Admission (non-emergent condition) []  - 0 New Admissions / Manufacturing engineernsurance Authorizations / Ordering NPWT, Apligraf, etc. []  - 0 Emergency Hospital Admission (emergent condition) X- 1 10 Simple Discharge Coordination [name], [name] ([id number]) []  - 0 Complex (extensive) Discharge Coordination PROCESS - Special Needs []  - Pediatric / Minor Patient Management 0 []  - 0 Isolation Patient Management []  - 0 Hearing / Language / Visual special needs []  - 0 Assessment of Community assistance (transportation, D/C planning, etc.) []  - 0 Additional assistance / Altered mentation []  - 0 Support Surface(s) Assessment (bed, cushion, seat, etc.) INTERVENTIONS - Wound Cleansing / Measurement X - Simple Wound Cleansing - one wound 1 5 []  - 0 Complex Wound  Cleansing - multiple wounds X- 1 5 Wound Imaging (photographs - any number of wounds) []  - 0 Wound Tracing (instead of photographs) X- 1 5 Simple Wound Measurement -  one wound []  - 0 Complex Wound Measurement - multiple wounds INTERVENTIONS - Wound Dressings X - Small Wound Dressing one or multiple wounds 1 10 []  - 0 Medium Wound Dressing one or multiple wounds []  - 0 Large Wound Dressing one or multiple wounds []  - 0 Application of Medications - topical []  - 0 Application of Medications - injection INTERVENTIONS - Miscellaneous []  - External ear exam 0 []  - 0 Specimen Collection (cultures, biopsies, blood, body fluids, etc.) []  - 0 Specimen(s) / Culture(s) sent or taken to Lab for analysis []  - 0 Patient Transfer (multiple staff / Nurse, adult / Similar devices) []  - 0 Simple Staple / Suture removal (25 or less) []  - 0 Complex Staple / Suture removal (26 or more) []  - 0 Hypo / Hyperglycemic Management (close monitor of Blood Glucose) []  - 0 Ankle / Brachial Index (ABI) - do not check if billed separately X- 1 5 Vital Signs [name], [name] ([id number]) Has the patient been seen at the hospital within the last three years: Yes Total Score: 75 Level Of Care: New/Established - Level 2 Electronic Signature(s) Signed: 11/14/2017 5:02:13 PM By: Catherine Sites Entered By: Catherine Sites on 11/14/2017 10:43:13 [name], Catherine Baptist. ([id number]) -------------------------------------------------------------------------------- Encounter Discharge Information Details Patient Name: Catherine Armstrong, Catherine Armstrong. Date of Service: 11/14/2017 9:15 AM Medical Record Number: [id number] Patient Account Number: [id number] Date of Birth/Sex: 1976/05/03 (41 y.o. Female) Treating RN: Catherine Sites Primary Care Brittish Bolinger: Catherine Armstrong Other Clinician: Referring Cillian Gwinner: Catherine Armstrong Treating Asia Favata/Extender: Catherine Re in Treatment: 2 Encounter Discharge Information Items Discharge Pain  Level: 0 Discharge Condition: Stable Ambulatory Status: Ambulatory Discharge Destination: Home Transportation: Private Auto Accompanied By: self Schedule Follow-up Appointment: Yes Medication Reconciliation completed and No provided to Patient/Care Katelin Kutsch: Provided on Clinical Summary of Care: 11/14/2017 Form Type Recipient Paper Patient NP Electronic Signature(s) Signed: 11/14/2017 5:02:13 PM By: Catherine Sites Entered By: Catherine Sites on 11/14/2017 10:44:07 Gabay, Catherine Baptist. ([id number]) -------------------------------------------------------------------------------- Multi Wound Chart Details Patient Name: TOYOKO, SILOS. Date of Service: 11/14/2017 9:15 AM Medical Record Number: [id number] Patient Account Number: [id number] Date of Birth/Sex: [birth year] (41 y.o. Female) Treating RN: Catherine Sites Primary Care Elijah Michaelis: Catherine Armstrong Other Clinician: Referring Dorissa Stinnette: Catherine Armstrong Treating Tylar Amborn/Extender: Catherine Re in Treatment: 2 Vital Signs Height(in): 69 Pulse(bpm): 97 Weight(lbs): 499 Blood Pressure(mmHg): 152/82 Body Mass Index(BMI): 74 Temperature(F): 98.4 Respiratory Rate 18 (breaths/min): Photos: [1:No Photos] [N/A:N/A] Wound Location: [1:Left Abdomen - Lower Quadrant] [N/A:N/A] Wounding Event: [1:Shear/Friction] [N/A:N/A] Primary Etiology: [1:Pressure Ulcer] [N/A:N/A] Comorbid History: [1:Deep Vein Thrombosis, Raynaudos, Osteoarthritis] [N/A:N/A] Date Acquired: [1:09/29/2017] [N/A:N/A] Armstrong of Treatment: [1:2] [N/A:N/A] Wound Status: [1:Open] [N/A:N/A] Measurements L x W x D [1:0.2x0.2x0.1] [N/A:N/A] (cm) Area (cm) : [1:0.031] [N/A:N/A] Volume (cm) : [1:0.003] [N/A:N/A] % Reduction in Area: [1:98.50%] [N/A:N/A] % Reduction in Volume: [1:99.30%] [N/A:N/A] Classification: [1:Category/Stage III] [N/A:N/A] Exudate Amount: [1:Large] [N/A:N/A] Exudate Type: [1:Purulent] [N/A:N/A] Exudate Color: [1:yellow, brown, green]  [N/A:N/A] Wound Margin: [1:Flat and Intact] [N/A:N/A] Granulation Amount: [1:Large (67-100%)] [N/A:N/A] Granulation Quality: [1:Pink] [N/A:N/A] Necrotic Amount: [1:Small (1-33%)] [N/A:N/A] Exposed Structures: [1:Fascia: No Fat Layer (Subcutaneous Tissue) Exposed: No Tendon: No Muscle: No Joint: No Bone: No] [N/A:N/A] Epithelialization: [1:Medium (34-66%)] [N/A:N/A] Periwound Skin Texture: [1:Scarring: Yes Excoriation: No Induration: No Callus: No] [N/A:N/A] Crepitus: No Rash: No Periwound Skin Moisture: Maceration: No N/A N/A Dry/Scaly: No Periwound Skin Color: Atrophie Blanche: No N/A N/A Cyanosis: No Ecchymosis: No Erythema: No Hemosiderin Staining: No Mottled: No Pallor: No Rubor: No Temperature: No Abnormality N/A N/A Tenderness on Palpation:  No N/A N/A Wound Preparation: Ulcer Cleansing: N/A N/A Rinsed/Irrigated with Saline Topical Anesthetic Applied: Other: lidocaine 4% Treatment Notes Electronic Signature(s) Signed: 11/14/2017 9:43:21 AM By: Evlyn KannerBritto, Errol MD, FACS Entered By: Evlyn KannerBritto, Catherine on 11/14/2017 09:43:21 Bonenfant, Catherine BaptistNIKKI M. ([id number]) -------------------------------------------------------------------------------- Multi-Disciplinary Care Plan Details Patient Name: Catherine Armstrong, [name] Date of Service: 11/14/2017 9:15 AM Medical Record Number: [id number] Patient Account Number: [id number] Date of Birth/Sex: [birth year] (41 y.o. Female) Treating RN: Catherine Armstrong Primary Care Seeley Hissong: Catherine Armstrong Other Clinician: Referring Raeven Pint: Catherine Armstrong Treating Lynton Crescenzo/Extender: Catherine Armstrong in Treatment: 2 Active Inactive Electronic Signature(s) Signed: 01/07/2018 11:24:13 AM By: Elliot GurneyWoody, BSN, RN, CWS, Kim RN, BSN Signed: 01/21/2018 4:21:50 PM By: Catherine Armstrong Previous Signature: 11/14/2017 5:02:13 PM Version By: Catherine Armstrong Entered By: Elliot GurneyWoody, BSN, RN, CWS, Kim on 01/07/2018 11:24:12 Catherine Armstrong, [name]  ([id number]) -------------------------------------------------------------------------------- Pain Assessment Details Patient Name: Catherine Armstrong, Estefanny M. Date of Service: 11/14/2017 9:15 AM Medical Record Number: [id number] Patient Account Number: [id number] Date of Birth/Sex: [birth year] (41 y.o. Female) Treating RN: Catherine Armstrong Primary Care Kelsei Defino: Catherine Armstrong Other Clinician: Referring Percilla Tweten: Catherine Armstrong Treating Cornelius Marullo/Extender: Catherine Armstrong in Treatment: 2 Active Problems Location of Pain Severity and Description of Pain Patient Has Paino No Site Locations Pain Management and Medication Current Pain Management: Electronic Signature(s) Signed: 11/14/2017 5:02:13 PM By: Catherine Armstrong Entered By: Catherine Armstrong on 11/14/2017 09:22:19 Angelo, Catherine BaptistNIKKI M. ([id number]) -------------------------------------------------------------------------------- Patient/Caregiver Education Details Patient Name: Catherine Armstrong, Emori M. Date of Service: 11/14/2017 9:15 AM Medical Record Number: [id number] Patient Account Number: [id number] Date of Birth/Gender: [birth year] (41 y.o. Female) Treating RN: Catherine Armstrong Primary Care Physician: Catherine Armstrong Other Clinician: Referring Physician: WHITE, Armstrong Treating Physician/Extender: Catherine Armstrong in Treatment: 2 Education Assessment Education Provided To: Patient Education Topics Provided Wound/Skin Impairment: Handouts: Other: wound care as ordered Methods: Demonstration, Explain/Verbal Responses: State content correctly Electronic Signature(s) Signed: 11/14/2017 5:02:13 PM By: Catherine Armstrong Entered By: Catherine Armstrong on 11/14/2017 10:44:22 Endo, Catherine BaptistNIKKI M. ([id number]) -------------------------------------------------------------------------------- Wound Assessment Details Patient Name: Catherine Armstrong, Kmya M. Date of Service: 11/14/2017 9:15 AM Medical Record Number: [id number] Patient Account Number: [id number] Date of  Birth/Sex: [birth year] (41 y.o. Female) Treating RN: Catherine Armstrong Primary Care Aymar Whitfill: Catherine Armstrong Other Clinician: Referring Zanden Colver: Catherine Armstrong Treating Rosette Bellavance/Extender: Catherine Armstrong in Treatment: 2 Wound Status Wound Number: 1 Primary Pressure Ulcer Etiology: Wound Location: Left Abdomen - Lower Quadrant Wound Status: Open Wounding Event: Shear/Friction Comorbid Deep Vein Thrombosis, Raynaudos, Date Acquired: 09/29/2017 History: Osteoarthritis Armstrong Of Treatment: 2 Clustered Wound: No Photos Photo Uploaded By: Catherine Armstrong on 11/14/2017 14:26:28 Wound Measurements Length: (cm) 0.2 Width: (cm) 0.2 Depth: (cm) 0.1 Area: (cm) 0.031 Volume: (cm) 0.003 % Reduction in Area: 98.5% % Reduction in Volume: 99.3% Epithelialization: Medium (34-66%) Tunneling: No Undermining: No Wound Description Classification: Category/Stage III Wound Margin: Flat and Intact Exudate Amount: Large Exudate Type: Purulent Exudate Color: yellow, brown, green Foul Odor After Cleansing: No Slough/Fibrino Yes Wound Bed Granulation Amount: Large (67-100%) Exposed Structure Granulation Quality: Pink Fascia Exposed: No Necrotic Amount: Small (1-33%) Fat Layer (Subcutaneous Tissue) Exposed: No Necrotic Quality: Adherent Slough Tendon Exposed: No Muscle Exposed: No Joint Exposed: No Bone Exposed: No Periwound Skin Texture Fetterman, Jasslyn M. ([id number]) Texture Color No Abnormalities Noted: No No Abnormalities Noted: No Callus: No Atrophie Blanche: No Crepitus: No Cyanosis: No Excoriation: No Ecchymosis: No Induration: No Erythema: No Rash: No Hemosiderin Staining: No Scarring: Yes Mottled: No Pallor: No Moisture Rubor: No No Abnormalities Noted: No Dry / Scaly: No Temperature / Pain Maceration:  No Temperature: No Abnormality Wound Preparation Ulcer Cleansing: Rinsed/Irrigated with Saline Topical Anesthetic Applied: Other: lidocaine 4%, Electronic  Signature(s) Signed: 11/14/2017 5:02:13 PM By: Catherine Sites Entered By: Catherine Sites on 11/14/2017 09:29:56 Arneson, Catherine Baptist. ([id number]) -------------------------------------------------------------------------------- Vitals Details Patient Name: NECHELLE, PETRIZZO. Date of Service: 11/14/2017 9:15 AM Medical Record Number: [id number] Patient Account Number: [id number] Date of Birth/Sex: Jan 21, 1976 (41 y.o. Female) Treating RN: Catherine Sites Primary Care Morio Widen: Catherine Armstrong Other Clinician: Referring Lucus Lambertson: Catherine Armstrong Treating Gaynell Eggleton/Extender: Catherine Re in Treatment: 2 Vital Signs Time Taken: 09:23 Temperature (F): 98.4 Height (in): 69 Pulse (bpm): 97 Weight (lbs): 499 Respiratory Rate (breaths/min): 18 Body Mass Index (BMI): 73.7 Blood Pressure (mmHg): 152/82 Reference Range: 80 - 120 mg / dl Electronic Signature(s) Signed: 11/14/2017 5:02:13 PM By: Catherine Sites Entered By: Catherine Sites on 11/14/2017 09:24:12

## 2017-11-16 NOTE — Progress Notes (Signed)
ANGELIGUE, BOWNE M. ([id number]) Visit Report for 11/14/2017 Chief Complaint Document Details Patient Name: Catherine Armstrong, Catherine Armstrong. Date of Service: 11/14/2017 9:15 AM Medical Record Number: [id number] Patient Account Number: [id number] Date of Birth/Sex: 1976/04/06 (41 y.o. Female) Treating RN: Curtis Sites Primary Care Provider: WHITE, CYNTHIA Other Clinician: Referring Provider: WHITE, CYNTHIA Treating Provider/Extender: Catherine Re in Treatment: 2 Information Obtained from: Patient Chief Complaint Patient seen for complaints of Non-Healing Wound to her lower abdomen wall which has been there for about a month Electronic Signature(s) Signed: 11/14/2017 9:43:27 AM By: Evlyn Kanner MD, FACS Entered By: Evlyn Kanner on 11/14/2017 09:43:27 [name], [name] ([id number]) -------------------------------------------------------------------------------- HPI Details Patient Name: Catherine Armstrong, Catherine Armstrong. Date of Service: 11/14/2017 9:15 AM Medical Record Number: [id number] Patient Account Number: [id number] Date of Birth/Sex: Oct 26, 1976 (41 y.o. Female) Treating RN: Curtis Sites Primary Care Provider: WHITE, CYNTHIA Other Clinician: Referring Provider: WHITE, CYNTHIA Treating Provider/Extender: Catherine Re in Treatment: 2 History of Present Illness Location: left lower abdominal wall on her pannus Quality: Patient reports experiencing a dull pain to affected area(s). Severity: Patient states wound are getting worse. Duration: Patient has had the wound for < 4 weeks prior to presenting for treatment Timing: Pain in wound is Intermittent (comes and goes Context: The wound occurred when the patient had what sounds like a for uncle which opened out and drained some pus Modifying Factors: Other treatment(s) tried include:local care with Neosporin ointment Associated Signs and Symptoms: Patient reports having increase discharge. HPI Description: 42 year old morbidly obese patient who has  a history of getting furuncles every now and then under her arms, possibly had a furuncle on her anterior abdomen wall in the region of a large pannus on the left lower quadrant. This drained some fluid or pus and the patient thinks since then it has been frustrating due to not being able to offload it. other than that she has not had any major issues like diabetes and is not a smoker. Electronic Signature(s) Signed: 11/14/2017 9:43:32 AM By: Evlyn Kanner MD, FACS Entered By: Evlyn Kanner on 11/14/2017 09:43:32 Catherine Ravel. ([id number]) -------------------------------------------------------------------------------- Physical Exam Details Patient Name: Catherine Armstrong, Catherine Armstrong. Date of Service: 11/14/2017 9:15 AM Medical Record Number: [id number] Patient Account Number: [id number] Date of Birth/Sex: Mar 28, 1976 (41 y.o. Female) Treating RN: Curtis Sites Primary Care Provider: WHITE, CYNTHIA Other Clinician: Referring Provider: WHITE, CYNTHIA Treating Provider/Extender: Catherine Re in Treatment: 2 Constitutional . Pulse regular. Respirations normal and unlabored. Afebrile. . Eyes Nonicteric. Reactive to light. Ears, Nose, Mouth, and Throat Lips, teeth, and gums WNL.Marland Kitchen Moist mucosa without lesions. Neck supple and nontender. No palpable supraclavicular or cervical adenopathy. Normal sized without goiter. Respiratory WNL. No retractions.. Cardiovascular Pedal Pulses WNL. No clubbing, cyanosis or edema. Lymphatic No adneopathy. No adenopathy. No adenopathy. Musculoskeletal Adexa without tenderness or enlargement.. Digits and nails w/o clubbing, cyanosis, infection, petechiae, ischemia, or inflammatory conditions.. Integumentary (Hair, Skin) No suspicious lesions. No crepitus or fluctuance. No peri-wound warmth or erythema. No masses.Marland Kitchen Psychiatric Judgement and insight Intact.. No evidence of depression, anxiety, or agitation.. Notes the wound is looking excellent and there is  no sharp debridement required. Electronic Signature(s) Signed: 11/14/2017 9:44:00 AM By: Evlyn Kanner MD, FACS Entered By: Evlyn Kanner on 11/14/2017 09:43:59 Catherine Ravel. ([id number]) -------------------------------------------------------------------------------- Physician Orders Details Patient Name: Catherine Armstrong, Catherine Armstrong. Date of Service: 11/14/2017 9:15 AM Medical Record Number: [id number] Patient Account Number: [id number] Date of Birth/Sex: 11/18/1976 (41 y.o. Female) Treating RN: Curtis Sites Primary Care Provider: WHITE, CYNTHIA Other Clinician: Referring  Provider: WHITE, CYNTHIA Treating Provider/Extender: Catherine Armstrong Weeks in Treatment: 2 Verbal / Phone Orders: No Diagnosis Coding Wound Cleansing Wound #1 Left Abdomen - Lower Quadrant o Clean wound with Normal Saline. o Cleanse wound with mild soap and water o May Shower, gently pat wound dry prior to applying new dressing. Anesthetic Wound #1 Left Abdomen - Lower Quadrant o Topical Lidocaine 4% cream applied to wound bed prior to debridement Skin Barriers/Peri-Wound Care Wound #1 Left Abdomen - Lower Quadrant o Skin Prep Primary Wound Dressing Wound #1 Left Abdomen - Lower Quadrant o Prisma Ag Secondary Dressing Wound #1 Left Abdomen - Lower Quadrant o Dry Gauze o Boardered Foam Dressing - or telfa island dressing Dressing Change Frequency Wound #1 Left Abdomen - Lower Quadrant o Change dressing every day. Follow-up Appointments Wound #1 Left Abdomen - Lower Quadrant o Return Appointment in 1 week. Additional Orders / Instructions Wound #1 Left Abdomen - Lower Quadrant o Increase protein intake. o Other: - Please add vitamin A, vitamin C, multivitamin, and zinc supplements to your diet Electronic Signature(s) Signed: 11/14/2017 10:18:33 AM By: Evlyn KannerBritto, Leotis Isham MD, FACS Signed: 11/14/2017 5:02:13 PM By: Catherine Asaorthy, Joanna Vizzini, Lowella BandyNIKKI M. ([id number]) Entered By: Curtis Sitesorthy, Joanna on  11/14/2017 09:35:58 [name], [name] ([id number]) -------------------------------------------------------------------------------- Problem List Details Patient Name: Catherine Armstrong, [name] Date of Service: 11/14/2017 9:15 AM Medical Record Number: [id number] Patient Account Number: [id number] Date of Birth/Sex: Jun 02, 1976 (41 y.o. Female) Treating RN: Curtis Sitesorthy, Joanna Primary Care Provider: WHITE, CYNTHIA Other Clinician: Referring Provider: WHITE, CYNTHIA Treating Provider/Extender: Catherine ReBritto, Meghana Tullo Weeks in Treatment: 2 Active Problems ICD-10 Encounter Code Description Active Date Diagnosis S31.104A Unspecified open wound of abdominal wall, left lower quadrant 10/30/2017 Yes without penetration into peritoneal cavity, initial encounter L02.221 Furuncle of abdominal wall 10/30/2017 Yes E66.01 Morbid (severe) obesity due to excess calories 10/30/2017 Yes Inactive Problems Resolved Problems Electronic Signature(s) Signed: 11/14/2017 9:43:17 AM By: Evlyn KannerBritto, Marveline Profeta MD, FACS Entered By: Evlyn KannerBritto, Shade Kaley on 11/14/2017 09:43:17 [place], [name] ([id number]) -------------------------------------------------------------------------------- Progress Note Details Patient Name: Catherine Armstrong, [name] Date of Service: 11/14/2017 9:15 AM Medical Record Number: [id number] Patient Account Number: [id number] Date of Birth/Sex: Jun 02, 1976 (41 y.o. Female) Treating RN: Curtis Sitesorthy, Joanna Primary Care Provider: WHITE, CYNTHIA Other Clinician: Referring Provider: WHITE, CYNTHIA Treating Provider/Extender: Catherine ReBritto, Harless Molinari Weeks in Treatment: 2 Subjective Chief Complaint Information obtained from Patient Patient seen for complaints of Non-Healing Wound to her lower abdomen wall which has been there for about a month History of Present Illness (HPI) The following HPI elements were documented for the patient's wound: Location: left lower abdominal wall on her pannus Quality: Patient reports experiencing a dull pain to affected  area(s). Severity: Patient states wound are getting worse. Duration: Patient has had the wound for < 4 weeks prior to presenting for treatment Timing: Pain in wound is Intermittent (comes and goes Context: The wound occurred when the patient had what sounds like a for uncle which opened out and drained some pus Modifying Factors: Other treatment(s) tried include:local care with Neosporin ointment Associated Signs and Symptoms: Patient reports having increase discharge. 41 year old morbidly obese patient who has a history of getting furuncles every now and then under her arms, possibly had a furuncle on her anterior abdomen wall in the region of a large pannus on the left lower quadrant. This drained some fluid or pus and the patient thinks since then it has been frustrating due to not being able to offload it. other than that she has not had any major issues like diabetes and  is not a smoker. Patient History Information obtained from Patient. Family History Cancer - Father, Diabetes - Father,Paternal Grandparents, Kidney Disease - Father, Tuberculosis - Maternal Grandparents, No family history of Heart Disease, Hereditary Spherocytosis, Hypertension, Lung Disease, Seizures, Stroke, Thyroid Problems. Social History Never smoker, Marital Status - Married, Alcohol Use - Moderate, Drug Use - No History, Caffeine Use - Rarely. Objective Constitutional Pulse regular. Respirations normal and unlabored. Afebrile. Steward RosURKETT, [name] ([id number]) Vitals Time Taken: 9:23 AM, Height: 69 in, Weight: 499 lbs, BMI: 73.7, Temperature: 98.4 F, Pulse: 97 bpm, Respiratory Rate: 18 breaths/min, Blood Pressure: 152/82 mmHg. Eyes Nonicteric. Reactive to light. Ears, Nose, Mouth, and Throat Lips, teeth, and gums WNL.Marland Kitchen. Moist mucosa without lesions. Neck supple and nontender. No palpable supraclavicular or cervical adenopathy. Normal sized without goiter. Respiratory WNL. No  retractions.. Cardiovascular Pedal Pulses WNL. No clubbing, cyanosis or edema. Lymphatic No adneopathy. No adenopathy. No adenopathy. Musculoskeletal Adexa without tenderness or enlargement.. Digits and nails w/o clubbing, cyanosis, infection, petechiae, ischemia, or inflammatory conditions.Marland Kitchen. Psychiatric Judgement and insight Intact.. No evidence of depression, anxiety, or agitation.. General Notes: the wound is looking excellent and there is no sharp debridement required. Integumentary (Hair, Skin) No suspicious lesions. No crepitus or fluctuance. No peri-wound warmth or erythema. No masses.. Wound #1 status is Open. Original cause of wound was Shear/Friction. The wound is located on the Left Abdomen - Lower Quadrant. The wound measures 0.2cm length x 0.2cm width x 0.1cm depth; 0.031cm^2 area and 0.003cm^3 volume. There is no tunneling or undermining noted. There is a large amount of purulent drainage noted. The wound margin is flat and intact. There is large (67-100%) pink granulation within the wound bed. There is a small (1-33%) amount of necrotic tissue within the wound bed including Adherent Slough. The periwound skin appearance exhibited: Scarring. The periwound skin appearance did not exhibit: Callus, Crepitus, Excoriation, Induration, Rash, Dry/Scaly, Maceration, Atrophie Blanche, Cyanosis, Ecchymosis, Hemosiderin Staining, Mottled, Pallor, Rubor, Erythema. Periwound temperature was noted as No Abnormality. Assessment Active Problems ICD-10 S31.104A - Unspecified open wound of abdominal wall, left lower quadrant without penetration into peritoneal cavity, initial encounter L02.221 - Furuncle of abdominal wall E66.01 - Morbid (severe) obesity due to excess calories Leadbetter, Maddyson M. ([id number]) Plan Wound Cleansing: Wound #1 Left Abdomen - Lower Quadrant: Clean wound with Normal Saline. Cleanse wound with mild soap and water May Shower, gently pat wound dry prior to applying  new dressing. Anesthetic: Wound #1 Left Abdomen - Lower Quadrant: Topical Lidocaine 4% cream applied to wound bed prior to debridement Skin Barriers/Peri-Wound Care: Wound #1 Left Abdomen - Lower Quadrant: Skin Prep Primary Wound Dressing: Wound #1 Left Abdomen - Lower Quadrant: Prisma Ag Secondary Dressing: Wound #1 Left Abdomen - Lower Quadrant: Dry Gauze Boardered Foam Dressing - or telfa island dressing Dressing Change Frequency: Wound #1 Left Abdomen - Lower Quadrant: Change dressing every day. Follow-up Appointments: Wound #1 Left Abdomen - Lower Quadrant: Return Appointment in 1 week. Additional Orders / Instructions: Wound #1 Left Abdomen - Lower Quadrant: Increase protein intake. Other: - Please add vitamin A, vitamin C, multivitamin, and zinc supplements to your diet she lost her tube of Santyl but at this stage I do not believe she will need to buy anymore, and after review I have recommended: 1. Prisma Ag locally to be applied every other day after washing with soap and water. 2. offloading this area as well as possible 3. due to the holiday we will see her back in 2 weeks' time  Electronic Signature(s) Signed: 11/14/2017 9:45:39 AM By: Evlyn Kanner MD, FACS Entered By: Evlyn Kanner on 11/14/2017 09:45:38 Catherine Ravel. ([id number]) -------------------------------------------------------------------------------- ROS/PFSH Details Patient Name: Catherine Armstrong, Catherine Armstrong. Date of Service: 11/14/2017 9:15 AM Medical Record Number: [id number] Patient Account Number: [id number] Date of Birth/Sex: 1976-02-06 (41 y.o. Female) Treating RN: Curtis Sites Primary Care Provider: WHITE, CYNTHIA Other Clinician: Referring Provider: WHITE, CYNTHIA Treating Provider/Extender: Catherine Re in Treatment: 2 Information Obtained From Patient Wound History Do you currently have one or more open woundso Yes How many open wounds do you currently haveo 1 Approximately how long  have you had your woundso 1 month How have you been treating your wound(s) until nowo neosporin and bandaid Has your wound(s) ever healed and then re-openedo No Have you had any lab work done in the past montho No Have you tested positive for an antibiotic resistant organism (MRSA, VRE)o No Have you tested positive for osteomyelitis (bone infection)o No Have you had any tests for circulation on your legso No Hematologic/Lymphatic Medical History: Negative for: Anemia; Hemophilia; Human Immunodeficiency Virus; Lymphedema; Sickle Cell Disease Respiratory Medical History: Negative for: Aspiration; Asthma; Chronic Obstructive Pulmonary Disease (COPD); Pneumothorax; Sleep Apnea; Tuberculosis Cardiovascular Medical History: Positive for: Deep Vein Thrombosis - 2 years ago Negative for: Angina; Arrhythmia; Congestive Heart Failure; Coronary Artery Disease; Hypertension; Hypotension; Myocardial Infarction; Peripheral Arterial Disease; Peripheral Venous Disease; Phlebitis; Vasculitis Gastrointestinal Medical History: Negative for: Cirrhosis ; Colitis; Crohnos; Hepatitis A; Hepatitis B; Hepatitis C Endocrine Medical History: Negative for: Type I Diabetes; Type II Diabetes Genitourinary Medical History: Negative for: End Stage Renal Disease Immunological Medical History: Positive for: Raynaudos Catherine Armstrong, [name] ([id number]) Negative for: Lupus Erythematosus; Scleroderma Integumentary (Skin) Medical History: Negative for: History of Burn; History of pressure wounds Musculoskeletal Medical History: Positive for: Osteoarthritis Negative for: Gout; Rheumatoid Arthritis; Osteomyelitis Oncologic Medical History: Negative for: Received Chemotherapy; Received Radiation Immunizations Pneumococcal Vaccine: Received Pneumococcal Vaccination: No Tetanus Vaccine: Last tetanus shot: 10/29/2017 Immunization Notes: up to date Implantable Devices Family and Social History Cancer: Yes -  Father; Diabetes: Yes - Father,Paternal Grandparents; Heart Disease: No; Hereditary Spherocytosis: No; Hypertension: No; Kidney Disease: Yes - Father; Lung Disease: No; Seizures: No; Stroke: No; Thyroid Problems: No; Tuberculosis: Yes - Maternal Grandparents; Never smoker; Marital Status - Married; Alcohol Use: Moderate; Drug Use: No History; Caffeine Use: Rarely; Financial Concerns: No; Food, Clothing or Shelter Needs: No; Support System Lacking: No; Transportation Concerns: No; Advanced Directives: No; Patient does not want information on Advanced Directives Physician Affirmation I have reviewed and agree with the above information. Electronic Signature(s) Signed: 11/14/2017 10:18:33 AM By: Evlyn Kanner MD, FACS Signed: 11/14/2017 5:02:13 PM By: Curtis Sites Entered By: Evlyn Kanner on 11/14/2017 09:43:40 [place]con, [name] ([id number]) -------------------------------------------------------------------------------- SuperBill Details Patient Name: Catherine Armstrong, Catherine Armstrong. Date of Service: 11/14/2017 Medical Record Number: [id number] Patient Account Number: [id number] Date of Birth/Sex: [birth year] (41 y.o. Female) Treating RN: Curtis Sites Primary Care Provider: WHITE, CYNTHIA Other Clinician: Referring Provider: WHITE, CYNTHIA Treating Provider/Extender: Catherine Re in Treatment: 2 Diagnosis Coding ICD-10 Codes Code Description Unspecified open wound of abdominal wall, left lower quadrant without penetration into peritoneal cavity, S31.104A initial encounter L02.221 Furuncle of abdominal wall E66.01 Morbid (severe) obesity due to excess calories Facility Procedures CPT4 Code: [id number] Description: 25366 - WOUND CARE VISIT-LEV 2 EST PT Modifier: Quantity: 1 Physician Procedures CPT4: Description Modifier Quantity Code [id number] - WC PHYS LEVEL 3 - EST PT 1 ICD-10 Diagnosis Description S31.104A Unspecified open wound of abdominal wall, left  lower quadrant without  penetration into peritoneal cavity, initial encounter L02.221  Furuncle of abdominal wall E66.01 Morbid (severe) obesity due to excess calories Electronic Signature(s) Signed: 11/14/2017 4:27:13 PM By: Evlyn Kanner MD, FACS Signed: 11/14/2017 5:02:13 PM By: Curtis Sites Previous Signature: 11/14/2017 9:45:49 AM Version By: Evlyn Kanner MD, FACS Entered By: Curtis Sites on 11/14/2017 10:43:24

## 2017-11-28 ENCOUNTER — Ambulatory Visit: Payer: BLUE CROSS/BLUE SHIELD | Admitting: Surgery

## 2018-03-03 ENCOUNTER — Observation Stay (HOSPITAL_COMMUNITY)
Admission: EM | Admit: 2018-03-03 | Discharge: 2018-03-04 | Disposition: A | Discharge status: Home / Self Care | Payer: BLUE CROSS/BLUE SHIELD | Attending: Family Medicine | Admitting: Family Medicine

## 2018-03-03 ENCOUNTER — Emergency Department (HOSPITAL_COMMUNITY): Payer: BLUE CROSS/BLUE SHIELD

## 2018-03-03 ENCOUNTER — Encounter (HOSPITAL_COMMUNITY): Payer: Self-pay | Admitting: Emergency Medicine

## 2018-03-03 ENCOUNTER — Other Ambulatory Visit: Payer: Self-pay

## 2018-03-03 DIAGNOSIS — Z7902 Long term (current) use of antithrombotics/antiplatelets: Secondary | ICD-10-CM | POA: Insufficient documentation

## 2018-03-03 DIAGNOSIS — Z6841 Body Mass Index (BMI) 40.0 and over, adult: Secondary | ICD-10-CM | POA: Insufficient documentation

## 2018-03-03 DIAGNOSIS — N390 Urinary tract infection, site not specified: Secondary | ICD-10-CM | POA: Insufficient documentation

## 2018-03-03 DIAGNOSIS — R0600 Dyspnea, unspecified: Secondary | ICD-10-CM | POA: Diagnosis not present

## 2018-03-03 DIAGNOSIS — R0789 Other chest pain: Secondary | ICD-10-CM | POA: Diagnosis not present

## 2018-03-03 DIAGNOSIS — I2699 Other pulmonary embolism without acute cor pulmonale: Principal | ICD-10-CM | POA: Diagnosis present

## 2018-03-03 DIAGNOSIS — Z79899 Other long term (current) drug therapy: Secondary | ICD-10-CM | POA: Diagnosis not present

## 2018-03-03 DIAGNOSIS — R079 Chest pain, unspecified: Secondary | ICD-10-CM | POA: Diagnosis not present

## 2018-03-03 DIAGNOSIS — R0602 Shortness of breath: Secondary | ICD-10-CM | POA: Diagnosis not present

## 2018-03-03 DIAGNOSIS — R0981 Nasal congestion: Secondary | ICD-10-CM | POA: Diagnosis not present

## 2018-03-03 DIAGNOSIS — Z86718 Personal history of other venous thrombosis and embolism: Secondary | ICD-10-CM | POA: Insufficient documentation

## 2018-03-03 DIAGNOSIS — R062 Wheezing: Secondary | ICD-10-CM | POA: Diagnosis not present

## 2018-03-03 DIAGNOSIS — I82409 Acute embolism and thrombosis of unspecified deep veins of unspecified lower extremity: Secondary | ICD-10-CM | POA: Diagnosis not present

## 2018-03-03 HISTORY — DX: Acute embolism and thrombosis of unspecified deep veins of unspecified lower extremity: I82.409

## 2018-03-03 LAB — CBC WITH DIFFERENTIAL/PLATELET
BASOS PCT: 0 %
Basophils Absolute: 0 10*3/uL (ref 0.0–0.1)
EOS ABS: 0.1 10*3/uL (ref 0.0–0.7)
EOS PCT: 1 %
HCT: 40.3 % (ref 36.0–46.0)
Hemoglobin: 13.1 g/dL (ref 12.0–15.0)
Lymphocytes Relative: 12 %
Lymphs Abs: 1.3 10*3/uL (ref 0.7–4.0)
MCH: 28.4 pg (ref 26.0–34.0)
MCHC: 32.5 g/dL (ref 30.0–36.0)
MCV: 87.4 fL (ref 78.0–100.0)
MONO ABS: 0.7 10*3/uL (ref 0.1–1.0)
MONOS PCT: 7 %
Neutro Abs: 8.3 10*3/uL — ABNORMAL HIGH (ref 1.7–7.7)
Neutrophils Relative %: 80 %
PLATELETS: 288 10*3/uL (ref 150–400)
RBC: 4.61 MIL/uL (ref 3.87–5.11)
RDW: 15.9 % — AB (ref 11.5–15.5)
WBC: 10.3 10*3/uL (ref 4.0–10.5)

## 2018-03-03 LAB — URINALYSIS, ROUTINE W REFLEX MICROSCOPIC
Bilirubin Urine: NEGATIVE
GLUCOSE, UA: NEGATIVE mg/dL
Ketones, ur: 5 mg/dL — AB
NITRITE: NEGATIVE
Protein, ur: NEGATIVE mg/dL
SPECIFIC GRAVITY, URINE: 1.015 (ref 1.005–1.030)
pH: 6 (ref 5.0–8.0)

## 2018-03-03 LAB — BASIC METABOLIC PANEL
Anion gap: 9 (ref 5–15)
BUN: 13 mg/dL (ref 6–20)
CALCIUM: 9.3 mg/dL (ref 8.9–10.3)
CHLORIDE: 106 mmol/L (ref 101–111)
CO2: 27 mmol/L (ref 22–32)
CREATININE: 0.77 mg/dL (ref 0.44–1.00)
Glucose, Bld: 91 mg/dL (ref 65–99)
POTASSIUM: 3.7 mmol/L (ref 3.5–5.1)
SODIUM: 142 mmol/L (ref 135–145)

## 2018-03-03 LAB — D-DIMER, QUANTITATIVE: D-Dimer, Quant: 2.33 ug/mL-FEU — ABNORMAL HIGH (ref 0.00–0.50)

## 2018-03-03 LAB — I-STAT BETA HCG BLOOD, ED (MC, WL, AP ONLY)

## 2018-03-03 MED ORDER — HEPARIN (PORCINE) IN NACL 100-0.45 UNIT/ML-% IJ SOLN
2150.0000 [IU]/h | INTRAMUSCULAR | Status: DC
  Administered 2018-03-03: 2150 [IU]/h via INTRAVENOUS
  Filled 2018-03-03 (×2): qty 250

## 2018-03-03 MED ORDER — HEPARIN BOLUS VIA INFUSION
5000.0000 [IU] | Freq: Once | INTRAVENOUS | Status: AC
Start: 2018-03-03 — End: 2018-03-03
  Administered 2018-03-03: 5000 [IU] via INTRAVENOUS
  Filled 2018-03-03: qty 5000

## 2018-03-03 MED ORDER — IOPAMIDOL (ISOVUE-370) INJECTION 76%
INTRAVENOUS | Status: AC
  Administered 2018-03-03: 100 mL via INTRAVENOUS
  Filled 2018-03-03: qty 100

## 2018-03-03 MED ORDER — SODIUM CHLORIDE 0.9 % IJ SOLN
INTRAMUSCULAR | Status: AC
  Filled 2018-03-03: qty 50

## 2018-03-03 NOTE — ED Notes (Signed)
ED TO INPATIENT HANDOFF REPORT  Name/Age/Gender Catherine Armstrong 42 y.o. female  Code Status Code Status History    This patient does not have a recorded code status. Please follow your organizational policy for patients in this situation.      Home/SNF/Other Home  Chief Complaint SOB  Level of Care/Admitting Diagnosis ED Disposition    ED Disposition Condition Comment   Admit  Hospital Area: [facility] [[id number]]  Level of Care: Telemetry [5]  Admit to tele based on following criteria: Other see comments  Comments: PE  Diagnosis: Pulmonary embolism [facility]) [[id number]]  Admitting Physician: Kara Pacer  Attending Physician: Bethena Roys [phone]  Estimated length of stay: past midnight tomorrow  Certification:: I certify this patient will need inpatient services for at least 2 midnights  PT Class (Do Not Modify): Inpatient [101]  PT Acc Code (Do Not Modify): Private [1]       Medical History Past Medical History:  Diagnosis Date  . DVT (deep venous thrombosis) ([place]) 2016   left leg  . Obesity     Allergies No Known Allergies  IV Location/Drains/Wounds Patient Lines/Drains/Airways Status   Active Line/Drains/Airways    Name:   Placement date:   Placement time:   Site:   Days:   Peripheral IV 03/03/18 Left Forearm   03/03/18    1657    Forearm   less than 1          Labs/Imaging Results for orders placed or performed during the hospital encounter of 03/03/18 (from the past 48 hour(s))  Basic metabolic panel     Status: None   Collection Time: 03/03/18  5:01 PM  Result Value Ref Range   Sodium 142 135 - 145 mmol/L   Potassium 3.7 3.5 - 5.1 mmol/L   Chloride 106 101 - 111 mmol/L   CO2 27 22 - 32 mmol/L   Glucose, Bld 91 65 - 99 mg/dL   BUN 13 6 - 20 mg/dL   Creatinine, Ser 0.77 0.44 - 1.00 mg/dL   Calcium 9.3 8.9 - 10.3 mg/dL   GFR calc non Af Amer >60 >60 mL/min   GFR calc Af Amer >60 >60 mL/min    Comment:  (NOTE) The eGFR has been calculated using the CKD EPI equation. This calculation has not been validated in all clinical situations. eGFR's persistently <60 mL/min signify possible Chronic Kidney Disease.    Anion gap 9 5 - 15    Comment: Performed at [facility], [place] [address], [place], [place] [address]  CBC with Differential     Status: Abnormal   Collection Time: 03/03/18  5:01 PM  Result Value Ref Range   WBC 10.3 4.0 - 10.5 K/uL   RBC 4.61 3.87 - 5.11 MIL/uL   Hemoglobin 13.1 12.0 - 15.0 g/dL   HCT 40.3 36.0 - 46.0 %   MCV 87.4 78.0 - 100.0 fL   MCH 28.4 26.0 - 34.0 pg   MCHC 32.5 30.0 - 36.0 g/dL   RDW 15.9 (H) 11.5 - 15.5 %   Platelets 288 150 - 400 K/uL   Neutrophils Relative % 80 %   Neutro Abs 8.3 (H) 1.7 - 7.7 K/uL   Lymphocytes Relative 12 %   Lymphs Abs 1.3 0.7 - 4.0 K/uL   Monocytes Relative 7 %   Monocytes Absolute 0.7 0.1 - 1.0 K/uL   Eosinophils Relative 1 %   Eosinophils Absolute 0.1 0.0 - 0.7 K/uL  Basophils Relative 0 %   Basophils Absolute 0.0 0.0 - 0.1 K/uL    Comment: Performed at [facility], [place] [address] [place], [place] [address]  D-dimer, quantitative     Status: Abnormal   Collection Time: 03/03/18  5:01 PM  Result Value Ref Range   D-Dimer, Quant 2.33 (H) 0.00 - 0.50 ug/mL-FEU    Comment: (NOTE) At the manufacturer cut-off of 0.50 ug/mL FEU, this assay has been documented to exclude PE with a sensitivity and negative predictive value of 97 to 99%.  At this time, this assay has not been approved by the FDA to exclude DVT/VTE. Results should be correlated with clinical presentation. Performed at [facility], [place] [address], [place], [place] [address]   I-Stat beta hCG blood, ED     Status: None   Collection Time: 03/03/18  5:05 PM  Result Value Ref Range   I-stat hCG, quantitative <5.0 <5 mIU/mL   Comment 3            Comment:   GEST. AGE      CONC.  (mIU/mL)   <=1 WEEK         5 - 50     2 WEEKS       50 - 500     3 WEEKS       100 - 10,000     4 WEEKS     1,000 - 30,000        FEMALE AND NON-PREGNANT FEMALE:     LESS THAN 5 mIU/mL    Dg Chest 2 View  Result Date: 03/03/2018 CLINICAL DATA:  Shortness of breath and chest pain for several days, history DVT EXAM: CHEST - 2 VIEW COMPARISON:  None FINDINGS: Enlargement of cardiac silhouette with pulmonary vascular congestion. Lungs grossly clear. No definite acute infiltrate, pleural effusion or pneumothorax. Bones unremarkable. IMPRESSION: Enlargement of cardiac silhouette with pulmonary vascular congestion. No definite acute infiltrate. Electronically Signed   By: Lavonia Dana M.D.   On: 03/03/2018 16:43   Ct Angio Chest Pe W/cm &/or Wo Cm  Result Date: 03/03/2018 CLINICAL DATA:  Shortness of breath and left upper back pain EXAM: CT ANGIOGRAPHY CHEST WITH CONTRAST TECHNIQUE: Multidetector CT imaging of the chest was performed using the standard protocol during bolus administration of intravenous contrast. Multiplanar CT image reconstructions and MIPs were obtained to evaluate the vascular anatomy. CONTRAST:  172m ISOVUE-370 IOPAMIDOL (ISOVUE-370) INJECTION 76% COMPARISON:  Chest x-ray 03/03/2018 FINDINGS: Cardiovascular: Satisfactory opacification of the pulmonary arteries to the segmental level. Slightly grainy appearance of the images due to large body habitus. Multiple filling defects within the distal right pulmonary artery, the right inter lobar pulmonary artery, and multiple segmental and subsegmental right upper and lower lobe pulmonary vessels. Small emboli also suspected within lingular and left lower lobe subsegmental pulmonary arterial vessels. RV LV ratio is non elevated. Mild cardiomegaly. No pericardial effusion. Nonaneurysmal aorta Mediastinum/Nodes: No enlarged mediastinal, hilar, or axillary lymph nodes. Thyroid gland, trachea, and esophagus demonstrate no significant findings. Lungs/Pleura: No pleural effusion or  pneumothorax. Minimal ground-glass foci within the right greater than left lung base. Upper Abdomen: No acute abnormality. Musculoskeletal: No chest wall abnormality. No acute or significant osseous findings. Review of the MIP images confirms the above findings. IMPRESSION: 1. Positive for acute bilateral right greater than left pulmonary emboli. No evidence for right heart strain 2. Small foci of ground-glass density in the posterior right greater than left lower lobes, may reflect  small foci of infection, or possible small pulmonary infarcts Critical Value/emergent results were called by telephone at the time of interpretation on 03/03/2018 at 6:38 pm to Dr. Dene Gentry , who verbally acknowledged these results. Electronically Signed   By: Donavan Foil M.D.   On: 03/03/2018 18:38    Pending Labs Unresulted Labs (From admission, onward)   Start     Ordered   03/05/18 0500  CBC  Daily,   R     03/03/18 2028   03/04/18 0300  Heparin level (unfractionated)  Once-Timed,   R     03/03/18 2028   03/04/18 0300  CBC  Once,   R     03/03/18 2028   03/03/18 2053  Urinalysis, Routine w reflex microscopic  Once,   R     03/03/18 2052   Signed and Held  HIV antibody (Routine Testing)  Once,   R     Signed and Held      Vitals/Pain Today's Vitals   03/03/18 1859 03/03/18 2000 03/03/18 2100 03/03/18 2200  BP: (!) 165/79 (!) 149/84 (!) 145/95 118/85  Pulse: 98 88 97 (!) 105  Resp: 18 18 (!) 24 (!) 21  SpO2: 100% 100% 99% 99%  Weight:      Height:      PainSc: 0-No pain       Isolation Precautions No active isolations  Medications Medications  sodium chloride 0.9 % injection (not administered)  heparin ADULT infusion 100 units/mL (25000 units/261m sodium chloride 0.45%) (2,150 Units/hr Intravenous New Bag/Given 03/03/18 2025)  iopamidol (ISOVUE-370) 76 % injection (100 mLs Intravenous Contrast Given 03/03/18 1809)  heparin bolus via infusion 5,000 Units (5,000 Units Intravenous Bolus from Bag  03/03/18 2026)    Mobility walks

## 2018-03-03 NOTE — ED Notes (Signed)
Sent by PCP, states patient is morbidly obese-states tachy and SOB-coming here for possible DVT/PE

## 2018-03-03 NOTE — ED Provider Notes (Signed)
[facility]-EMERGENCY DEPT Provider Note   CSN: [id number] Arrival date & time: 03/03/18  1603     History   Chief Complaint Chief Complaint  Patient presents with  . Shortness of Breath    HPI Catherine Armstrong is a 42 y.o. female.  42 year old female with history of morbid obesity, DVT presents with complaint of upper respiratory congestion, mild cough, mild shortness of breath, and mild left-sided pleuritic thoracic pain.  Patient reports that her symptoms started 2 days previously.  Patient was seen by her primary today and sent to the ED for further evaluation.  She denies fevers.  Patient denies significant shortness of breath at rest. She reports mild intermittent "wheezing" sensation at home.   She reports that she was diagnosed with DVT in her lower extremity about 3 years previously.  She was on Xarelto briefly following that diagnosis.  She has never had a PE in the past.  She reports that her DVT was possibly secondary to prolonged plane travel.  Patient reports that she intermittently takes aspirin on a daily basis.   The history is provided by the patient.  URI   This is a new problem. The current episode started 2 days ago. The problem has not changed since onset.There has been no fever. Associated symptoms include chest pain, congestion and wheezing. She has tried nothing for the symptoms. The treatment provided no relief.    Past Medical History:  Diagnosis Date  . Obesity     There are no active problems to display for this patient.   History reviewed. No pertinent surgical history.  OB History    No data available       Home Medications    Prior to Admission medications   Medication Sig Start Date End Date Taking? Authorizing Provider  rivaroxaban (XARELTO) 20 MG TABS tablet Take 20 mg by mouth daily with supper.    [provider]    Family History Family History  Problem Relation Age of Onset  . Obesity Other   .  Stroke Other   . Hypertension Other   . Hyperlipidemia Other   . Diabetes Other     Social History Social History   Tobacco Use  . Smoking status: Not on file  Substance Use Topics  . Alcohol use: Not on file  . Drug use: Not on file     Allergies   Patient has no known allergies.   Review of Systems Review of Systems  HENT: Positive for congestion.   Respiratory: Positive for wheezing.   Cardiovascular: Positive for chest pain.  All other systems reviewed and are negative.    Physical Exam Updated Vital Signs LMP 02/04/2018   Physical Exam  Constitutional: She is oriented to person, place, and time. She appears well-developed and well-nourished. No distress.  HENT:  Head: Normocephalic and atraumatic.  Mouth/Throat: Oropharynx is clear and moist.  Eyes: Conjunctivae and EOM are normal. Pupils are equal, round, and reactive to light.  Neck: Normal range of motion. Neck supple.  Cardiovascular: Normal rate, regular rhythm and normal heart sounds.  Pulmonary/Chest: Effort normal and breath sounds normal. No respiratory distress.  Abdominal: Soft. She exhibits no distension. There is no tenderness.  Musculoskeletal: Normal range of motion. She exhibits no edema or deformity.  Neurological: She is alert and oriented to person, place, and time.  Skin: Skin is warm and dry.  Psychiatric: She has a normal mood and affect.  Nursing note and vitals reviewed.  ED Treatments / Results  Labs (all labs ordered are listed, but only abnormal results are displayed) Labs Reviewed  CBC WITH DIFFERENTIAL/PLATELET - Abnormal; Notable for the following components:      Result Value   RDW 15.9 (*)    Neutro Abs 8.3 (*)    All other components within normal limits  D-DIMER, QUANTITATIVE (NOT AT [facility]) - Abnormal; Notable for the following components:   D-Dimer, Quant 2.33 (*)    All other components within normal limits  BASIC METABOLIC PANEL  I-STAT BETA HCG BLOOD, ED ([facility],  [facility], [facility] ONLY)    EKG  EKG Interpretation  Date/Time:  Tuesday March 03 2018 16:19:17 EST Ventricular Rate:  102 PR Interval:    QRS Duration: 91 QT Interval:  354 QTC Calculation: 462 R Axis:   -73 Text Interpretation:  Sinus tachycardia Left anterior fascicular block Abnormal R-wave progression, late transition Confirmed by Kristine RoyalMessick, Peter [phone]) on 03/03/2018 4:23:49 PM       Radiology Dg Chest 2 View  Result Date: 03/03/2018 CLINICAL DATA:  Shortness of breath and chest pain for several days, history DVT EXAM: CHEST - 2 VIEW COMPARISON:  None FINDINGS: Enlargement of cardiac silhouette with pulmonary vascular congestion. Lungs grossly clear. No definite acute infiltrate, pleural effusion or pneumothorax. Bones unremarkable. IMPRESSION: Enlargement of cardiac silhouette with pulmonary vascular congestion. No definite acute infiltrate. Electronically Signed   By: Ulyses SouthwardMark  Boles M.D.   On: 03/03/2018 16:43   Ct Angio Chest Pe W/cm &/or Wo Cm  Result Date: 03/03/2018 CLINICAL DATA:  Shortness of breath and left upper back pain EXAM: CT ANGIOGRAPHY CHEST WITH CONTRAST TECHNIQUE: Multidetector CT imaging of the chest was performed using the standard protocol during bolus administration of intravenous contrast. Multiplanar CT image reconstructions and MIPs were obtained to evaluate the vascular anatomy. CONTRAST:  100mL ISOVUE-370 IOPAMIDOL (ISOVUE-370) INJECTION 76% COMPARISON:  Chest x-ray 03/03/2018 FINDINGS: Cardiovascular: Satisfactory opacification of the pulmonary arteries to the segmental level. Slightly grainy appearance of the images due to large body habitus. Multiple filling defects within the distal right pulmonary artery, the right inter lobar pulmonary artery, and multiple segmental and subsegmental right upper and lower lobe pulmonary vessels. Small emboli also suspected within lingular and left lower lobe subsegmental pulmonary arterial vessels. RV LV ratio is non elevated. Mild  cardiomegaly. No pericardial effusion. Nonaneurysmal aorta Mediastinum/Nodes: No enlarged mediastinal, hilar, or axillary lymph nodes. Thyroid gland, trachea, and esophagus demonstrate no significant findings. Lungs/Pleura: No pleural effusion or pneumothorax. Minimal ground-glass foci within the right greater than left lung base. Upper Abdomen: No acute abnormality. Musculoskeletal: No chest wall abnormality. No acute or significant osseous findings. Review of the MIP images confirms the above findings. IMPRESSION: 1. Positive for acute bilateral right greater than left pulmonary emboli. No evidence for right heart strain 2. Small foci of ground-glass density in the posterior right greater than left lower lobes, may reflect small foci of infection, or possible small pulmonary infarcts Critical Value/emergent results were called by telephone at the time of interpretation on 03/03/2018 at 6:38 pm to Dr. Kristine RoyalPETER MESSICK , who verbally acknowledged these results. Electronically Signed   By: Jasmine PangKim  Fujinaga M.D.   On: 03/03/2018 18:38    Procedures Procedures (including critical care time)  Medications Ordered in ED Medications - No data to display   Initial Impression / Assessment and Plan / ED Course  I have reviewed the triage vital signs and the nursing notes.  Pertinent labs & imaging results that were  available during my care of the patient were reviewed by me and considered in my medical decision making (see chart for details).     MDM  Screen complete  She is presenting with shortness of breath and pleuritic chest pain.  D-dimer was elevated.  CTA Chest shows bilateral PE's.  Patient with mild tachycardia but otherwise hemodynamically stable.  Room air sats are 100%.  Other screening labs are without significant abnormality.  Patient will be admitted to the hospital service for further treatment and evaluation.  Hospitalist service aware of case and will place order for anti-coagulation.      Final Clinical Impressions(s) / ED Diagnoses   Final diagnoses:  Other acute pulmonary embolism without acute cor pulmonale [facility])    ED Discharge Orders    None       Wynetta Fines, MD 03/03/18 1910

## 2018-03-03 NOTE — ED Notes (Signed)
Patient transported to CT 

## 2018-03-03 NOTE — H&P (Signed)
History and Physical    Catherine Armstrong ZOX:[id number] DOB: [birth year] DOA: 03/03/2018  PCP: Laurann Montana, MD   Patient coming from: Home   Chief Complaint: Shortness of breath  HPI: Catherine Armstrong is a 42 y.o. female with medical history significant for DVT, morbid obesity, presented to the ED with complaints of shortness of breath present with exertion and nonradiating left upper back pain of 2 days duration.  Back pain is worse with deep breathing.  Patient denies chest pain.  Patient also reports 2-day history of nasal congestion and cough.  No fevers or chills. Patient's took a 3 -4 hr road trip to [facility], on Friday- 02/27/17, she reports bilateral leg swelling that started after that night, without redness or pain. She then had another 3-4hr Return road trip, 2 days later- 03/01/17.  When she got home she noted onset of shortness of breath -exertion and back pain.  Patient had a DVT in 2016, after an ~ 4hr flight.  She was on Xarelto for about 6 months.  Patient denies recent surgeries, no family history of premature coronary artery disease.   ED Course: Initial mild tachycardia 112, blood pressure stable systolic 140s-180s, 100% on room air.  BMP CBC unremarkable.  D-dimer was elevated subsequent CTA chest shows right greater than left pulmonary embolism. No heart strain. Small foci of groundglass density in posterior Rt > left lower lobes- small foci of infection versus possible pulmonary infarcts.  Hospitalist was called to admit for PE.  Review of Systems: As per HPI otherwise 10 point review of systems negative.    Past Medical History:  Diagnosis Date  . Obesity     History reviewed. No pertinent surgical history.   has no tobacco, alcohol, and drug history on file.  No Known Allergies  Family History  Problem Relation Age of Onset  . Obesity Other   . Stroke Other   . Hypertension Other   . Hyperlipidemia Other   . Diabetes Other     Prior to Admission  medications   Medication Sig Start Date End Date Taking? Authorizing Provider  aspirin EC 81 MG tablet Take 162 mg by mouth daily as needed for moderate pain.   Yes [provider]  ibuprofen (ADVIL,MOTRIN) 200 MG tablet Take 200 mg by mouth every 6 (six) hours as needed for moderate pain.   Yes [provider]  Pseudoeph-Doxylamine-DM-APAP (NYQUIL PO) Take 30 mLs by mouth at bedtime as needed (cold symptoms).   Yes [provider]  rivaroxaban (XARELTO) 20 MG TABS tablet Take 20 mg by mouth daily with supper.    [provider]    Physical Exam: Vitals:   03/03/18 1658 03/03/18 1859  BP: (!) 148/88 (!) 165/79  Pulse: (!) 112 98  Resp: 18 18  SpO2: 100% 100%    Constitutional: NAD, calm, comfortable Vitals:   03/03/18 1658 03/03/18 1859  BP: (!) 148/88 (!) 165/79  Pulse: (!) 112 98  Resp: 18 18  SpO2: 100% 100%   Eyes: PERRL, lids and conjunctivae normal ENMT: Mucous membranes are moist. Posterior pharynx clear of any exudate or lesions.Normal dentition.  Neck: normal, supple, no masses, no thyromegaly Respiratory: clear to auscultation bilaterally, no wheezing, no crackles. Normal respiratory effort. No accessory muscle use.  On room air Cardiovascular: Mild tachycardia, but regular rate and rhythm, no murmurs / rubs / gallops.  1+ bilateral lower extremity swelling.   no carotid bruits.  Abdomen: Morbidly obese abdomen, no tenderness, no  masses palpated. No hepatosplenomegaly. Bowel sounds positive.  Musculoskeletal: no clubbing / cyanosis. No joint deformity upper and lower extremities. Good ROM, no contractures. Normal muscle tone.  No tenderness of extremities, but chronic skin changes present. Skin: no rashes, lesions, ulcers. No induration Neurologic: CN 2-12 grossly intact. Sensation intact, DTR normal. Strength 5/5 in all 4.  Psychiatric: Normal judgment and insight. Alert and oriented x 3. Normal mood.   Labs on Admission: I have  personally reviewed following labs and imaging studies  CBC: Recent Labs  Lab 03/03/18 1701  WBC 10.3  NEUTROABS 8.3*  HGB 13.1  HCT 40.3  MCV 87.4  PLT 288   Basic Metabolic Panel: Recent Labs  Lab 03/03/18 1701  NA 142  K 3.7  CL 106  CO2 27  GLUCOSE 91  BUN 13  CREATININE 0.77  CALCIUM 9.3   Radiological Exams on Admission: Dg Chest 2 View  Result Date: 03/03/2018 CLINICAL DATA:  Shortness of breath and chest pain for several days, history DVT EXAM: CHEST - 2 VIEW COMPARISON:  None FINDINGS: Enlargement of cardiac silhouette with pulmonary vascular congestion. Lungs grossly clear. No definite acute infiltrate, pleural effusion or pneumothorax. Bones unremarkable. IMPRESSION: Enlargement of cardiac silhouette with pulmonary vascular congestion. No definite acute infiltrate. Electronically Signed   By: Ulyses Southward M.D.   On: 03/03/2018 16:43   Ct Angio Chest Pe W/cm &/or Wo Cm  Result Date: 03/03/2018 CLINICAL DATA:  Shortness of breath and left upper back pain EXAM: CT ANGIOGRAPHY CHEST WITH CONTRAST TECHNIQUE: Multidetector CT imaging of the chest was performed using the standard protocol during bolus administration of intravenous contrast. Multiplanar CT image reconstructions and MIPs were obtained to evaluate the vascular anatomy. CONTRAST:  ISOVUE-370 IOPAMIDOL (ISOVUE-370) INJECTION 76% COMPARISON:  Chest x-ray 03/03/2018 FINDINGS: Cardiovascular: Satisfactory opacification of the pulmonary arteries to the segmental level. Slightly grainy appearance of the images due to large body habitus. Multiple filling defects within the distal right pulmonary artery, the right inter lobar pulmonary artery, and multiple segmental and subsegmental right upper and lower lobe pulmonary vessels. Small emboli also suspected within lingular and left lower lobe subsegmental pulmonary arterial vessels. RV LV ratio is non elevated. Mild cardiomegaly. No pericardial effusion. Nonaneurysmal aorta  Mediastinum/Nodes: No enlarged mediastinal, hilar, or axillary lymph nodes. Thyroid gland, trachea, and esophagus demonstrate no significant findings. Lungs/Pleura: No pleural effusion or pneumothorax. Minimal ground-glass foci within the right greater than left lung base. Upper Abdomen: No acute abnormality. Musculoskeletal: No chest wall abnormality. No acute or significant osseous findings. Review of the MIP images confirms the above findings. IMPRESSION: 1. Positive for acute bilateral right greater than left pulmonary emboli. No evidence for right heart strain 2. Small foci of ground-glass density in the posterior right greater than left lower lobes, may reflect small foci of infection, or possible small pulmonary infarcts Critical Value/emergent results were called by telephone at the time of interpretation on 03/03/2018 at 6:38 pm to Dr. Kristine Royal , who verbally acknowledged these results. Electronically Signed   By: Jasmine Pang M.D.   On: 03/03/2018 18:38   EKG: Independently reviewed.  LAFB, sinus tachycardia no old EKG to compare.  Assessment/Plan Principal Problem:   Pulmonary embolism (HCC) Active Problems:   Morbid obesity (HCC)   DVT (deep venous thrombosis) (HCC)  Pulmonary embolism-likely provoked by recent 3-4-hour road trip.  Second provoked DVT in the setting of persistent risk factors-pt is morbidly obese.  Likely need prolonged/indefinite course of anticoagulation. CTA chest-bilateral  PE, no heart strain.  Small foci of Infection versus possible infarcts noted on CT- no fever, chills or leukocytosis to suggest infection.  Initial resolving cough and congestion likely viral. -Will Start heparin GTT pharmacy protocol, considering bilat PEs -Pending clinical stability transition to p.o. NOAC tomorrow  DVT Hx- 2016, provoked by travel, patient reports she was on Xarelto for  ~6 months.  Morbid obesity  DVT prophylaxis: Anticoagulation with heparin Code Status: full Family  Communication: Spouse Joshua at bedside Disposition Plan: 1-2 days Consults called: None  Admission status: Inpt, tele   Onnie BoerEjiroghene E Nicholis Stepanek MD [facility] Pager [phone] From 6PM-2AM.  Otherwise please contact night-coverage [email or web address] Password [email or web address]  03/03/2018, 7:14 PM

## 2018-03-03 NOTE — ED Triage Notes (Signed)
Patient c/o SOB and left upper back pain since yesterday. Reports SOB worsens on exertion. Denies chest pain, cough, congestion, N/V/D.

## 2018-03-03 NOTE — Progress Notes (Signed)
ANTICOAGULATION CONSULT NOTE - Initial Consult  Pharmacy Consult for heparin Indication: pulmonary embolus  No Known Allergies  Patient Measurements: Height: 5\' 9"  (175.3 cm) Weight: (!) 458 lb (207.7 kg) IBW/kg (Calculated) : 66.2 Heparin Dosing Weight: 120.2 kg  Vital Signs: BP: 165/79 (03/05 1859) Pulse Rate: 98 (03/05 1859)  Labs: Recent Labs    03/03/18 1701  HGB 13.1  HCT 40.3  PLT 288  CREATININE 0.77    Estimated Creatinine Clearance: 177.6 mL/min (by C-G formula based on SCr of 0.77 mg/dL).   Medical History: Past Medical History:  Diagnosis Date  . Obesity    Assessment: 42 yo F with acute B PE to start heparin per pharmacy.  She reports her ht is 5 foot 9 inches and her weight is 458 pounds.  She had a DVT 3 years ago and was on xarelto briefly - she is not currently on xarelto.  SCr 0.77, CBC WNL.   Goal of Therapy:  Heparin level 0.3-0.7 units/ml Monitor platelets by anticoagulation protocol: Yes   Plan:  Give 5000 units bolus x 1 Start heparin infusion at 2150 units/hr Check anti-Xa level in 6 hours and daily while on heparin Continue to monitor H&H and platelets  Herby AbrahamMichelle T. Tamim Skog, Pharm.D. [phone] 03/03/2018 7:27 PM

## 2018-03-03 NOTE — ED Notes (Signed)
Patient ambulatory to restroom at at this time. Without assistance.

## 2018-03-04 ENCOUNTER — Other Ambulatory Visit (HOSPITAL_COMMUNITY): Payer: BLUE CROSS/BLUE SHIELD

## 2018-03-04 ENCOUNTER — Inpatient Hospital Stay (HOSPITAL_BASED_OUTPATIENT_CLINIC_OR_DEPARTMENT_OTHER): Payer: BLUE CROSS/BLUE SHIELD

## 2018-03-04 DIAGNOSIS — I2699 Other pulmonary embolism without acute cor pulmonale: Secondary | ICD-10-CM | POA: Diagnosis not present

## 2018-03-04 DIAGNOSIS — I824Y9 Acute embolism and thrombosis of unspecified deep veins of unspecified proximal lower extremity: Secondary | ICD-10-CM | POA: Diagnosis not present

## 2018-03-04 LAB — CBC
HEMATOCRIT: 33.4 % — AB (ref 36.0–46.0)
HEMOGLOBIN: 10.6 g/dL — AB (ref 12.0–15.0)
MCH: 28.2 pg (ref 26.0–34.0)
MCHC: 31.7 g/dL (ref 30.0–36.0)
MCV: 88.8 fL (ref 78.0–100.0)
Platelets: 273 10*3/uL (ref 150–400)
RBC: 3.76 MIL/uL — ABNORMAL LOW (ref 3.87–5.11)
RDW: 16.2 % — ABNORMAL HIGH (ref 11.5–15.5)
WBC: 10.1 10*3/uL (ref 4.0–10.5)

## 2018-03-04 LAB — HIV ANTIBODY (ROUTINE TESTING W REFLEX): HIV SCREEN 4TH GENERATION: NONREACTIVE

## 2018-03-04 LAB — HEPARIN LEVEL (UNFRACTIONATED): Heparin Unfractionated: 0.25 IU/mL — ABNORMAL LOW (ref 0.30–0.70)

## 2018-03-04 MED ORDER — CIPROFLOXACIN HCL 500 MG PO TABS: 500.0000 mg | ORAL_TABLET | Freq: Two times a day (BID) | ORAL | 0 refills | Status: AC

## 2018-03-04 MED ORDER — RIVAROXABAN (XARELTO) VTE STARTER PACK (15 & 20 MG): ORAL_TABLET | ORAL | 0 refills | Status: DC

## 2018-03-04 MED ORDER — HEPARIN (PORCINE) IN NACL 100-0.45 UNIT/ML-% IJ SOLN
2600.0000 [IU]/h | INTRAMUSCULAR | Status: DC
  Administered 2018-03-04: 2600 [IU]/h via INTRAVENOUS
  Filled 2018-03-04: qty 250

## 2018-03-04 MED ORDER — RIVAROXABAN 15 MG PO TABS
15.0000 mg | ORAL_TABLET | Freq: Two times a day (BID) | ORAL | Status: DC
  Administered 2018-03-04: 15 mg via ORAL
  Filled 2018-03-04: qty 1

## 2018-03-04 MED ORDER — HEPARIN BOLUS VIA INFUSION
2500.0000 [IU] | Freq: Once | INTRAVENOUS | Status: AC
Start: 2018-03-04 — End: 2018-03-04
  Administered 2018-03-04: 2500 [IU] via INTRAVENOUS
  Filled 2018-03-04: qty 2500

## 2018-03-04 MED ORDER — CIPROFLOXACIN HCL 500 MG PO TABS
500.0000 mg | ORAL_TABLET | Freq: Two times a day (BID) | ORAL | Status: DC
  Administered 2018-03-04: 500 mg via ORAL
  Filled 2018-03-04: qty 1

## 2018-03-04 NOTE — Discharge Summary (Addendum)
Physician Discharge Summary [facility]       Patient: Catherine Armstrong                   Admit date: 03/03/2018   DOB: October 18, 1976             Discharge date:03/04/2018/1:00 PM ZOX:[id number]                           PCP: Laurann Montana, MD Recommendations for Outpatient Follow-up:   1.  Please follow-up with your primary care physician within 1-2 weeks.   Discharge Condition: Stable  CODE STATUS:  Full code   Diet recommendation:  Cardiac diet  healthy diet  ----------------------------------------------------------------------------------------------------------------------  Discharge Diagnoses:   Principal Problem:   Pulmonary embolism (HCC) Active Problems:   Morbid obesity (HCC)   DVT (deep venous thrombosis) (HCC)   History of present illness : Catherine Armstrong is a 42 y.o. female with medical history significant for DVT, morbid obesity, presented to the ED with complaints of shortness of breath present with exertion and nonradiating left upper back pain of 2 days duration.  Back pain is worse with deep breathing.  Patient denies chest pain.  Patient also reports 2-day history of nasal congestion and cough.  No fevers or chills. Patient's took a 3 -4 hr road trip to [facility], on Friday- 02/27/17, she reports bilateral leg swelling that started after that night, without redness or pain. She then had another 3-4hr Return road trip, 2 days later- 03/01/17.  When she got home she noted onset of shortness of breath -exertion and back pain.  Patient had a DVT in 2016, after an ~ 4hr flight.  She was on Xarelto for about 6 months.  Patient denies recent surgeries, no family history of premature coronary artery disease.   ED Course: Initial mild tachycardia 112, blood pressure stable systolic 140s-180s, 100% on room air.  BMP CBC unremarkable.  D-dimer was elevated subsequent CTA chest shows right greater than left pulmonary embolism. No heart strain. Small foci of  groundglass density in posterior Rt > left lower lobes- small foci of infection versus possible pulmonary infarcts.  Hospitalist was called to admit for PE.  Hospital course : she was subsequently admitted for acute provoked pulmonary embolism CT of the chest and 2D echocardiogram was reviewed.  Patient was started on IV heparin, was subsequently switched to Xarelto. Bilateral lower extremity was negative for any DVT   Urinary tract infection, started on ciprofloxacin p.o. twice daily times 7 days  Orbit obesity BMI greater than 45  Disposition patient was clear to be discharged home.  Patient is advised to follow-up with PCP, to discuss the duration of anticoagulation therapy we recommend greater than 6 months. Strongly advised to joint program for aggressive weight loss   Procedures:  Echocardiogram, CTA of the chest  Lower extremities venous Doppler study negative for DVT  ----------------------------------------------------------------------------------------------------------------------  Discharge Instructions:   Discharge Instructions    Activity as tolerated - No restrictions   Complete by:  As directed    Diet - low sodium heart healthy   Complete by:  As directed    Discharge instructions   Complete by:  As directed    Please follow-up with PCP, You need to be on chronic anticoagulation medication Xarelto for at least 6 months to a year may be determined by PCP Aggressive weight loss recommended   Increase activity slowly   Complete  by:  As directed        Medication List    TAKE these medications   aspirin EC 81 MG tablet Take 162 mg by mouth daily as needed for moderate pain.   ciprofloxacin 500 MG tablet Commonly known as:  CIPRO Take 1 tablet (500 mg total) by mouth 2 (two) times daily for 7 days.   ibuprofen 200 MG tablet Commonly known as:  ADVIL,MOTRIN Take 200 mg by mouth every 6 (six) hours as needed for moderate pain.   NYQUIL PO Take 30 mLs  by mouth at bedtime as needed (cold symptoms).   rivaroxaban 20 MG Tabs tablet Commonly known as:  XARELTO Take 20 mg by mouth daily with supper. What changed:  Another medication with the same name was added. Make sure you understand how and when to take each.   Rivaroxaban 15 & 20 MG Tbpk Take as directed on package: Start with one 15mg  tablet by mouth twice a day with food. On Day 22, switch to one 20mg  tablet once a day with food. What changed:  You were already taking a medication with the same name, and this prescription was added. Make sure you understand how and when to take each.       No Known Allergies    Procedures/Studies: Dg Chest 2 View  Result Date: 03/03/2018 CLINICAL DATA:  Shortness of breath and chest pain for several days, history DVT EXAM: CHEST - 2 VIEW COMPARISON:  None FINDINGS: Enlargement of cardiac silhouette with pulmonary vascular congestion. Lungs grossly clear. No definite acute infiltrate, pleural effusion or pneumothorax. Bones unremarkable. IMPRESSION: Enlargement of cardiac silhouette with pulmonary vascular congestion. No definite acute infiltrate. Electronically Signed   By: Ulyses Southward M.D.   On: 03/03/2018 16:43   Ct Angio Chest Pe W/cm &/or Wo Cm  Result Date: 03/03/2018 CLINICAL DATA:  Shortness of breath and left upper back pain EXAM: CT ANGIOGRAPHY CHEST WITH CONTRAST TECHNIQUE: Multidetector CT imaging of the chest was performed using the standard protocol during bolus administration of intravenous contrast. Multiplanar CT image reconstructions and MIPs were obtained to evaluate the vascular anatomy. CONTRAST:  ISOVUE-370 IOPAMIDOL (ISOVUE-370) INJECTION 76% COMPARISON:  Chest x-ray 03/03/2018 FINDINGS: Cardiovascular: Satisfactory opacification of the pulmonary arteries to the segmental level. Slightly grainy appearance of the images due to large body habitus. Multiple filling defects within the distal right pulmonary artery, the right inter  lobar pulmonary artery, and multiple segmental and subsegmental right upper and lower lobe pulmonary vessels. Small emboli also suspected within lingular and left lower lobe subsegmental pulmonary arterial vessels. RV LV ratio is non elevated. Mild cardiomegaly. No pericardial effusion. Nonaneurysmal aorta Mediastinum/Nodes: No enlarged mediastinal, hilar, or axillary lymph nodes. Thyroid gland, trachea, and esophagus demonstrate no significant findings. Lungs/Pleura: No pleural effusion or pneumothorax. Minimal ground-glass foci within the right greater than left lung base. Upper Abdomen: No acute abnormality. Musculoskeletal: No chest wall abnormality. No acute or significant osseous findings. Review of the MIP images confirms the above findings. IMPRESSION: 1. Positive for acute bilateral right greater than left pulmonary emboli. No evidence for right heart strain 2. Small foci of ground-glass density in the posterior right greater than left lower lobes, may reflect small foci of infection, or possible small pulmonary infarcts Critical Value/emergent results were called by telephone at the time of interpretation on 03/03/2018 at 6:38 pm to Dr. Kristine Royal , who verbally acknowledged these results. Electronically Signed   By: Jasmine Pang M.D.   On:  03/03/2018 18:38      Subjective: Patient was seen and examined 03/04/2018, 1:00 PM Patient stable  Today. No acute distress.  No issues overnight Stable for discharge.  Discharge Exam:  Vitals:   03/03/18 2300 03/03/18 2329 03/03/18 2342 03/04/18 0510  BP: 137/76 (!) 181/84 137/76 134/83  Pulse: 94 88 94 90  Resp: 19 19 19 20   Temp:  97.8 F (36.6 C) 98.6 F (37 C) 98 F (36.7 C)  TempSrc:  Oral Oral Oral  SpO2: 99% 96%  95%  Weight:   (!) 241.1 kg (531 lb 9.6 oz)   Height:   5\' 9"  (1.753 m)     General: Pt lying comfortably in bed & appears in no obvious distress. Cardiovascular: S1 & S2 heard, RRR, S1/S2 +. No murmurs, rubs, gallops or  clicks. No JVD or pedal edema. Respiratory: Clear to auscultation without wheezing, rhonchi or crackles. No increased work of breathing. Abdominal:  Non distended, non tender & soft. No organomegaly or masses appreciated. Normal bowel sounds heard. CNS: Alert and oriented. No focal deficits. Extremities: no edema, no cyanosis    The results of significant diagnostics from this hospitalization (including imaging, microbiology, ancillary and laboratory) are listed below for reference.     Microbiology: No results found for this or any previous visit (from the past 240 hour(s)).   Labs: CBC: Recent Labs  Lab 03/03/18 1701 03/04/18 0237  WBC 10.3 10.1  NEUTROABS 8.3*  --   HGB 13.1 10.6*  HCT 40.3 33.4*  MCV 87.4 88.8  PLT 288 273   Basic Metabolic Panel: Recent Labs  Lab 03/03/18 1701  NA 142  K 3.7  CL 106  CO2 27  GLUCOSE 91  BUN 13  CREATININE 0.77  CALCIUM 9.3   Anemia work up No results for input(s): VITAMINB12, FOLATE, FERRITIN, TIBC, IRON, RETICCTPCT in the last 72 hours. Urinalysis    Component Value Date/Time   COLORURINE YELLOW 03/03/2018 2053   APPEARANCEUR HAZY (A) 03/03/2018 2053   LABSPEC 1.015 03/03/2018 2053   PHURINE 6.0 03/03/2018 2053   GLUCOSEU NEGATIVE 03/03/2018 2053   HGBUR LARGE (A) 03/03/2018 2053   BILIRUBINUR NEGATIVE 03/03/2018 2053   KETONESUR 5 (A) 03/03/2018 2053   PROTEINUR NEGATIVE 03/03/2018 2053   NITRITE NEGATIVE 03/03/2018 2053   LEUKOCYTESUR MODERATE (A) 03/03/2018 2053    Time coordinating discharge: Over 30 minutes  SIGNED: Kendell BaneSeyed A Kendrew Paci, MD, FACP, [facility]. [facility],  Pager [phone]  If 7PM-7AM, please contact night-coverage [email or web address], Password [email or web address] 03/04/2018, 1:00 PM

## 2018-03-04 NOTE — Discharge Instructions (Signed)

## 2018-03-04 NOTE — Progress Notes (Signed)
Received order for Doppler Pre VAD from Dr. Flossie DibbleShahmehdi. Paged MD at 8:05 regarding order as we usually do not complete a VAD workup without a cardiology consult first. Requesting clarification. Spoke with RN at 8:15 that order is on hold until we hear back.   Farrel DemarkJill Eunice, RDMS, RVT Vascular lab [phone]

## 2018-03-04 NOTE — Progress Notes (Signed)
LE venous duplex prelim: technically limited due to body habitus. No obvious evidence of DVT in visualized veins. Farrel DemarkJill Eunice, RDMS, RVT

## 2018-03-04 NOTE — Progress Notes (Signed)
Pt's co pay for Xarelto is $50.00. Pt aware of co pay.

## 2018-03-09 DIAGNOSIS — D649 Anemia, unspecified: Secondary | ICD-10-CM | POA: Diagnosis not present

## 2018-03-09 DIAGNOSIS — I2699 Other pulmonary embolism without acute cor pulmonale: Secondary | ICD-10-CM | POA: Diagnosis not present

## 2018-03-09 DIAGNOSIS — R609 Edema, unspecified: Secondary | ICD-10-CM | POA: Diagnosis not present

## 2018-03-11 ENCOUNTER — Encounter: Payer: Self-pay | Admitting: Hematology and Oncology

## 2018-03-11 ENCOUNTER — Telehealth: Payer: Self-pay | Admitting: Hematology and Oncology

## 2018-03-11 NOTE — Telephone Encounter (Signed)
Appt has been scheduled for the pt to see Dr. Pamelia HoitGudena on 4/8 at 1pm. Pt aware to arrive 30 minutes early. Letter mailed to the pt and faxed to the referring.

## 2018-04-06 ENCOUNTER — Telehealth: Payer: Self-pay | Admitting: Hematology and Oncology

## 2018-04-06 ENCOUNTER — Inpatient Hospital Stay: Payer: BLUE CROSS/BLUE SHIELD | Attending: Hematology and Oncology | Admitting: Hematology and Oncology

## 2018-04-06 ENCOUNTER — Inpatient Hospital Stay: Payer: BLUE CROSS/BLUE SHIELD

## 2018-04-06 DIAGNOSIS — R918 Other nonspecific abnormal finding of lung field: Secondary | ICD-10-CM | POA: Diagnosis not present

## 2018-04-06 DIAGNOSIS — E669 Obesity, unspecified: Secondary | ICD-10-CM | POA: Insufficient documentation

## 2018-04-06 DIAGNOSIS — Z7982 Long term (current) use of aspirin: Secondary | ICD-10-CM | POA: Diagnosis not present

## 2018-04-06 DIAGNOSIS — Z79899 Other long term (current) drug therapy: Secondary | ICD-10-CM | POA: Diagnosis not present

## 2018-04-06 DIAGNOSIS — I2699 Other pulmonary embolism without acute cor pulmonale: Secondary | ICD-10-CM | POA: Diagnosis not present

## 2018-04-06 DIAGNOSIS — M7989 Other specified soft tissue disorders: Secondary | ICD-10-CM | POA: Insufficient documentation

## 2018-04-06 DIAGNOSIS — Z7901 Long term (current) use of anticoagulants: Secondary | ICD-10-CM

## 2018-04-06 LAB — ANTITHROMBIN III: AntiThromb III Func: 87 % (ref 75–120)

## 2018-04-06 NOTE — Telephone Encounter (Signed)
Gave avs and calendar ° °

## 2018-04-06 NOTE — Progress Notes (Signed)
[facility] CONSULT NOTE  Patient Care Team: Laurann Montana, MD as PCP - General (Family Medicine)  CHIEF COMPLAINTS/PURPOSE OF CONSULTATION:  DVT in 2016 and PE 2019  HISTORY OF PRESENTING ILLNESS:  Catherine Armstrong 42 y.o. female is here because of recent diagnosis of bilateral pulmonary emboli.  Patient had a previous history of DVT in 2016 that was treated with anticoagulation with Xarelto for 6 months.  This was after a 4-hour flight trip.  She presented to the hospital on 03/03/2018 with shortness of breath and left-sided upper back pain.  This was after recent road trip.  On 02/27/2018 she reported getting leg swelling after previous road trip.  When she got home she is felt short of breath.  CT chest revealed bilateral lower lobe pulmonary emboli.  There was some evidence of pulmonary infarction.  She was initially treated with IV heparin and later switched to Xarelto.  There was no DVT in the legs.  I reviewed her records extensively and collaborated the history with the patient.   MEDICAL HISTORY:  Past Medical History:  Diagnosis Date  . DVT (deep venous thrombosis) (HCC) 2016   left leg  . Obesity     SURGICAL HISTORY: Past Surgical History:  Procedure Laterality Date  . CESAREAN SECTION    . TONSILLECTOMY     adenoids removed, tubes put in both ears    SOCIAL HISTORY: Social History   Socioeconomic History  . Marital status: Married    Spouse name: Not on file  . Number of children: Not on file  . Years of education: Not on file  . Highest education level: Not on file  Occupational History  . Not on file  Social Needs  . Financial resource strain: Not on file  . Food insecurity:    Worry: Not on file    Inability: Not on file  . Transportation needs:    Medical: Not on file    Non-medical: Not on file  Tobacco Use  . Smoking status: Never Smoker  . Smokeless tobacco: Never Used  Substance and Sexual Activity  . Alcohol use: Yes    Comment:  occasional  . Drug use: No  . Sexual activity: Not on file  Lifestyle  . Physical activity:    Days per week: Not on file    Minutes per session: Not on file  . Stress: Not on file  Relationships  . Social connections:    Talks on phone: Not on file    Gets together: Not on file    Attends religious service: Not on file    Active member of club or organization: Not on file    Attends meetings of clubs or organizations: Not on file    Relationship status: Not on file  . Intimate partner violence:    Fear of current or ex partner: Not on file    Emotionally abused: Not on file    Physically abused: Not on file    Forced sexual activity: Not on file  Other Topics Concern  . Not on file  Social History Narrative  . Not on file    FAMILY HISTORY: Family History  Problem Relation Age of Onset  . Obesity Other   . Stroke Other   . Hypertension Other   . Hyperlipidemia Other   . Diabetes Other     ALLERGIES:  has No Known Allergies.  MEDICATIONS:  Current Outpatient Medications  Medication Sig Dispense Refill  . aspirin EC  81 MG tablet Take 162 mg by mouth daily as needed for moderate pain.    Marland Kitchen. ibuprofen (ADVIL,MOTRIN) 200 MG tablet Take 200 mg by mouth every 6 (six) hours as needed for moderate pain.    . Pseudoeph-Doxylamine-DM-APAP (NYQUIL PO) Take 30 mLs by mouth at bedtime as needed (cold symptoms).    . rivaroxaban (XARELTO) 20 MG TABS tablet Take 20 mg by mouth daily with supper.    . Rivaroxaban 15 & 20 MG TBPK Take as directed on package: Start with one 15mg  tablet by mouth twice a day with food. On Day 22, switch to one 20mg  tablet once a day with food. 51 each 0   No current facility-administered medications for this visit.     REVIEW OF SYSTEMS:   Constitutional: Denies fevers, chills or abnormal night sweats Eyes: Denies blurriness of vision, double vision or watery eyes Ears, nose, mouth, throat, and face: Denies mucositis or sore throat Respiratory:  Denies cough, dyspnea or wheezes Cardiovascular: Denies palpitation, chest discomfort or lower extremity swelling Gastrointestinal:  Denies nausea, heartburn or change in bowel habits Skin: Denies abnormal skin rashes Lymphatics: Denies new lymphadenopathy or easy bruising Neurological:Denies numbness, tingling or new weaknesses Behavioral/Psych: Mood is stable, no new changes   All other systems were reviewed with the patient and are negative.  PHYSICAL EXAMINATION: ECOG PERFORMANCE STATUS: 1 - Symptomatic but completely ambulatory  Vitals:   04/06/18 1302  BP: (!) 173/86  Pulse: 98  Resp: 18  Temp: 98.1 F (36.7 C)  SpO2: 99%   Filed Weights   04/06/18 1302  Weight: (!) 520 lb 1.6 oz (235.9 kg)    GENERAL:alert, no distress and comfortable SKIN: skin color, texture, turgor are normal, no rashes or significant lesions EYES: normal, conjunctiva are pink and non-injected, sclera clear OROPHARYNX:no exudate, no erythema and lips, buccal mucosa, and tongue normal  NECK: supple, thyroid normal size, non-tender, without nodularity LYMPH:  no palpable lymphadenopathy in the cervical, axillary or inguinal LUNGS: clear to auscultation and percussion with normal breathing effort HEART: regular rate & rhythm and no murmurs and no lower extremity edema ABDOMEN:abdomen soft, non-tender and normal bowel sounds Musculoskeletal:no cyanosis of digits and no clubbing  PSYCH: alert & oriented x 3 with fluent speech NEURO: no focal motor/sensory deficits  LABORATORY DATA:  I have reviewed the data as listed Lab Results  Component Value Date   WBC 10.1 03/04/2018   HGB 10.6 (L) 03/04/2018   HCT 33.4 (L) 03/04/2018   MCV 88.8 03/04/2018   PLT 273 03/04/2018   Lab Results  Component Value Date   NA 142 03/03/2018   K 3.7 03/03/2018   CL 106 03/03/2018   CO2 27 03/03/2018    RADIOGRAPHIC STUDIES: I have personally reviewed the radiological reports and agreed with the findings in  the report.  ASSESSMENT AND PLAN:  Pulmonary embolism (HCC) 03/03/2018: CT chest positive for acute bilateral right greater than left pulmonary emboli, no evidence of right heart strain, small foci of groundglass density lower lobes of the lung could be infection versus pulmonary infarcts  I discussed with the patient risk factors for blood clots.  Inherited risk factors include: 1. Factor V Leiden mutation 2. Prothrombin gene G20210A 3. Protein S deficiency  4. Protein C deficiency  5. Antithrombin deficiency  Acquired risk factors include: 1. Antiphospholipid antibody syndrome 2. Tobacco use 3. Obesity 4. Medications including oral contraceptives 5. Sedentary behavior including postoperative state 6. Foreign bodies in circulation  Workup recommended:  Bloodwork to evaluate for the 5 inherited factors mentioned above along with antiphospholipid antibodies.  Return to clinic in one to 3 weeks to discuss the results of the tests.  All questions were answered. The patient knows to call the clinic with any problems, questions or concerns.    Tamsen Meek, MD 04/06/18

## 2018-04-06 NOTE — Assessment & Plan Note (Signed)
03/03/2018: CT chest positive for acute bilateral right greater than left pulmonary emboli, no evidence of right heart strain, small foci of groundglass density lower lobes of the lung could be infection versus pulmonary infarcts  I discussed with the patient risk factors for blood clots.  Inherited risk factors include: 1. Factor V Leiden mutation 2. Prothrombin gene G20210A 3. Protein S deficiency  4. Protein C deficiency  5. Antithrombin deficiency  Acquired risk factors include: 1. Antiphospholipid antibody syndrome 2. Tobacco use 3. Obesity 4. Medications including oral contraceptives 5. Sedentary behavior including postoperative state 6. Foreign bodies in circulation  Workup recommended: Bloodwork to evaluate for the 5 inherited factors mentioned above along with antiphospholipid antibodies.  Return to clinic in one to 2 weeks to discuss the results of the tests.

## 2018-04-07 LAB — LUPUS ANTICOAGULANT PANEL
DRVVT: 44.8 s (ref 0.0–47.0)
PTT Lupus Anticoagulant: 35 s (ref 0.0–51.9)

## 2018-04-07 LAB — PROTEIN S, TOTAL: Protein S Ag, Total: 111 % (ref 60–150)

## 2018-04-07 LAB — HOMOCYSTEINE: Homocysteine: 7.7 umol/L (ref 0.0–15.0)

## 2018-04-08 LAB — BETA-2-GLYCOPROTEIN I ABS, IGG/M/A
Beta-2-Glycoprotein I IgA: <9 GPI IgA units (ref 0–25)
Beta-2-Glycoprotein I IgM: <9 GPI IgM units (ref 0–32)

## 2018-04-08 LAB — CARDIOLIPIN ANTIBODIES, IGG, IGM, IGA
Anticardiolipin IgA: <9 APL U/mL (ref 0–11)
Anticardiolipin IgG: <9 GPL U/mL (ref 0–14)

## 2018-04-09 LAB — FACTOR 5 LEIDEN

## 2018-04-09 LAB — PROTEIN C, TOTAL: Protein C, Total: 112 % (ref 60–150)

## 2018-04-10 LAB — PROTHROMBIN GENE MUTATION

## 2018-04-30 ENCOUNTER — Inpatient Hospital Stay: Payer: BLUE CROSS/BLUE SHIELD | Attending: Hematology and Oncology | Admitting: Hematology and Oncology

## 2018-04-30 VITALS — BP 140/82 | HR 89 | Temp 97.9°F | Resp 18 | Ht 69.0 in | Wt >= 6400 oz

## 2018-04-30 DIAGNOSIS — Z86718 Personal history of other venous thrombosis and embolism: Secondary | ICD-10-CM | POA: Diagnosis not present

## 2018-04-30 DIAGNOSIS — Z79899 Other long term (current) drug therapy: Secondary | ICD-10-CM | POA: Insufficient documentation

## 2018-04-30 DIAGNOSIS — Z7901 Long term (current) use of anticoagulants: Secondary | ICD-10-CM | POA: Insufficient documentation

## 2018-04-30 DIAGNOSIS — E669 Obesity, unspecified: Secondary | ICD-10-CM | POA: Insufficient documentation

## 2018-04-30 DIAGNOSIS — I824Y9 Acute embolism and thrombosis of unspecified deep veins of unspecified proximal lower extremity: Secondary | ICD-10-CM

## 2018-04-30 DIAGNOSIS — Z86711 Personal history of pulmonary embolism: Secondary | ICD-10-CM | POA: Insufficient documentation

## 2018-04-30 DIAGNOSIS — I2699 Other pulmonary embolism without acute cor pulmonale: Secondary | ICD-10-CM | POA: Diagnosis not present

## 2018-04-30 DIAGNOSIS — Z7982 Long term (current) use of aspirin: Secondary | ICD-10-CM | POA: Diagnosis not present

## 2018-04-30 NOTE — Progress Notes (Signed)
Patient Care Team: Laurann Montana, MD as PCP - General (Family Medicine)  DIAGNOSIS:  Encounter Diagnoses  Name Primary?  . Other acute pulmonary embolism without acute cor pulmonale (HCC) Yes  . Deep vein thrombosis (DVT) of proximal lower extremity, unspecified chronicity, unspecified laterality (HCC)    CHIEF COMPLIANT: Follow-up to discuss the results of blood work  INTERVAL HISTORY: Catherine Armstrong is a 42 year old with above-mentioned history of DVT and PE who underwent hypercoagulability work-up and is here today to discuss the results.  She is currently on Xarelto and appears to be tolerating it fairly well. She recently came back from a cruise vacation and reported to me that she lost 8 pounds and she is excited about that.  REVIEW OF SYSTEMS:   Constitutional: Denies fevers, chills or abnormal weight loss Eyes: Denies blurriness of vision Ears, nose, mouth, throat, and face: Denies mucositis or sore throat Respiratory: Denies cough, dyspnea or wheezes Cardiovascular: Denies palpitation, chest discomfort Gastrointestinal:  Denies nausea, heartburn or change in bowel habits Skin: Denies abnormal skin rashes Lymphatics: Denies new lymphadenopathy or easy bruising Neurological:Denies numbness, tingling or new weaknesses Behavioral/Psych: Mood is stable, no new changes  Extremities: No lower extremity edema  All other systems were reviewed with the patient and are negative.  I have reviewed the past medical history, past surgical history, social history and family history with the patient and they are unchanged from previous note.  ALLERGIES:  has No Known Allergies.  MEDICATIONS:  Current Outpatient Medications  Medication Sig Dispense Refill  . aspirin EC 81 MG tablet Take 162 mg by mouth daily as needed for moderate pain.    Marland Kitchen ibuprofen (ADVIL,MOTRIN) 200 MG tablet Take 200 mg by mouth every 6 (six) hours as needed for moderate pain.    . Pseudoeph-Doxylamine-DM-APAP  (NYQUIL PO) Take 30 mLs by mouth at bedtime as needed (cold symptoms).    . rivaroxaban (XARELTO) 20 MG TABS tablet Take 20 mg by mouth daily with supper.    . Rivaroxaban 15 & 20 MG TBPK Take as directed on package: Start with one  tablet by mouth twice a day with food. On Day 22, switch to one  tablet once a day with food. 51 each 0   No current facility-administered medications for this visit.     PHYSICAL EXAMINATION: ECOG PERFORMANCE STATUS: 1 - Symptomatic but completely ambulatory  Vitals:   04/30/18 0811  BP: 140/82  Pulse: 89  Resp: 18  Temp: 97.9 F (36.6 C)  SpO2: 100%   Filed Weights   04/30/18 0811  Weight: (!) 511 lb 3.2 oz (231.9 kg)    GENERAL:alert, no distress and comfortable SKIN: skin color, texture, turgor are normal, no rashes or significant lesions EYES: normal, Conjunctiva are pink and non-injected, sclera clear OROPHARYNX:no exudate, no erythema and lips, buccal mucosa, and tongue normal  NECK: supple, thyroid normal size, non-tender, without nodularity LYMPH:  no palpable lymphadenopathy in the cervical, axillary or inguinal LUNGS: clear to auscultation and percussion with normal breathing effort HEART: regular rate & rhythm and no murmurs and no lower extremity edema ABDOMEN:abdomen soft, non-tender and normal bowel sounds MUSCULOSKELETAL:no cyanosis of digits and no clubbing  NEURO: alert & oriented x 3 with fluent speech, no focal motor/sensory deficits EXTREMITIES: No lower extremity edema  LABORATORY DATA:  I have reviewed the data as listed CMP Latest Ref Rng & Units 03/03/2018  Glucose 65 - 99 mg/dL 91  BUN 6 - 20 mg/dL 13  Creatinine  0.44 - 1.00 mg/dL 1.61  Sodium 096 - 045 mmol/L 142  Potassium 3.5 - 5.1 mmol/L 3.7  Chloride 101 - 111 mmol/L 106  CO2 22 - 32 mmol/L 27  Calcium 8.9 - 10.3 mg/dL 9.3    Lab Results  Component Value Date   WBC 10.1 03/04/2018   HGB 10.6 (L) 03/04/2018   HCT 33.4 (L) 03/04/2018   MCV 88.8  03/04/2018   PLT 273 03/04/2018   NEUTROABS 8.3 (H) 03/03/2018    ASSESSMENT & PLAN:  Pulmonary embolism (HCC) 03/03/2018: CT chest positive for acute bilateral right greater than left pulmonary emboli, no evidence of right heart strain, small foci of groundglass density lower lobes of the lung could be infection versus pulmonary infarcts  Hypercoagulability work-up 04/06/2018: 1.  Lupus anticoagulant: Not detected  2. beta-2 glycoprotein 1 antibodies negative 3. Anticardiolipin antibodies negative  4.  Serum homocystine 7.7: Normal 5.  Antithrombin 3: 87% normal  6.  Protein C and protein S normal 7.  Factor V Leiden: No mutation found 8. Prothrombin gene mutation: No mutation identified I will provide the patient a copy of the blood work. There is no clear-cut etiology that could be identified for the cause of DVT and PE.  I recommended anticoagulation for 1 year.  Patient understands that she is at risk for recurrent blood clots in the future. Her major risk factor is obesity. If she continues to lose weight then it is possible that the risk would be reduced. If there was a recurrent blood clot, then she would need to be on anticoagulation for life. Return to clinic on an as-needed basis   No orders of the defined types were placed in this encounter.  The patient has a good understanding of the overall plan. she agrees with it. she will call with any problems that may develop before the next visit here.   Tamsen Meek, MD 04/30/18

## 2018-04-30 NOTE — Assessment & Plan Note (Signed)
03/03/2018: CT chest positive for acute bilateral right greater than left pulmonary emboli, no evidence of right heart strain, small foci of groundglass density lower lobes of the lung could be infection versus pulmonary infarcts  Hypercoagulability work-up 04/06/2018: 1.  Lupus anticoagulant: Not detected  2. beta-2 glycoprotein 1 antibodies negative 3. Anticardiolipin antibodies negative  4.  Serum homocystine 7.7: Normal 5.  Antithrombin 3: 87% normal  6.  Protein C and protein S normal 7.  Factor V Leiden: No mutation found 8. Prothrombin gene mutation: No mutation identified I will provide the patient a copy of the blood work. There is no clear-cut etiology that could be identified for the cause of DVT and PE.  I recommended anticoagulation for 6 months.  Patient understands that she is at risk for recurrent blood clots in the future. If there is recurrent blood clot that she would need to be on anticoagulation for life. Return to clinic on an as-needed basis

## 2019-03-25 DIAGNOSIS — R609 Edema, unspecified: Secondary | ICD-10-CM | POA: Diagnosis not present

## 2019-03-25 DIAGNOSIS — Z86718 Personal history of other venous thrombosis and embolism: Secondary | ICD-10-CM | POA: Diagnosis not present

## 2019-05-28 DIAGNOSIS — Z3A01 Less than 8 weeks gestation of pregnancy: Secondary | ICD-10-CM | POA: Diagnosis not present

## 2019-05-28 DIAGNOSIS — Z86718 Personal history of other venous thrombosis and embolism: Secondary | ICD-10-CM | POA: Diagnosis not present

## 2019-05-28 DIAGNOSIS — R609 Edema, unspecified: Secondary | ICD-10-CM | POA: Diagnosis not present

## 2019-05-28 DIAGNOSIS — N926 Irregular menstruation, unspecified: Secondary | ICD-10-CM | POA: Diagnosis not present

## 2019-06-02 ENCOUNTER — Telehealth: Payer: Self-pay | Admitting: Hematology and Oncology

## 2019-06-02 ENCOUNTER — Encounter: Payer: Self-pay | Admitting: Hematology and Oncology

## 2019-06-02 NOTE — Telephone Encounter (Signed)
Received a new referral from [facility] to sch an appt w/Dr. Pamelia Hoit for hx of dvt/pe. I attempted to call the pt, but her vm is full. Will mail a letter.

## 2019-06-07 ENCOUNTER — Ambulatory Visit: Payer: BLUE CROSS/BLUE SHIELD | Admitting: Hematology and Oncology

## 2019-06-16 ENCOUNTER — Other Ambulatory Visit: Payer: Self-pay | Admitting: Obstetrics and Gynecology

## 2019-06-16 ENCOUNTER — Other Ambulatory Visit (HOSPITAL_COMMUNITY)
Admission: RE | Admit: 2019-06-16 | Discharge: 2019-06-16 | Disposition: A | Discharge status: Home / Self Care | Payer: BC Managed Care – PPO | Source: Ambulatory Visit | Attending: Obstetrics and Gynecology | Admitting: Obstetrics and Gynecology

## 2019-06-16 DIAGNOSIS — Z01419 Encounter for gynecological examination (general) (routine) without abnormal findings: Secondary | ICD-10-CM | POA: Insufficient documentation

## 2019-06-16 DIAGNOSIS — R609 Edema, unspecified: Secondary | ICD-10-CM | POA: Diagnosis not present

## 2019-06-18 LAB — CYTOLOGY - PAP
Diagnosis: NEGATIVE
HPV: NOT DETECTED

## 2019-11-15 DIAGNOSIS — Z20828 Contact with and (suspected) exposure to other viral communicable diseases: Secondary | ICD-10-CM | POA: Diagnosis not present

## 2020-01-01 IMAGING — CT CT ANGIO CHEST
2 of 7 series · 18 of 46 positions shown · IV contrast (ISOVUE)
Comparison: Chest x-ray 03/03/2018

CLINICAL DATA: Shortness of breath and left upper back pain

EXAM:
CT ANGIOGRAPHY CHEST WITH CONTRAST
TECHNIQUE: Multidetector CT imaging of the chest was performed using the
standard protocol during bolus administration of intravenous
contrast. Multiplanar CT image reconstructions and MIPs were
obtained to evaluate the vascular anatomy.
CONTRAST:  100mL 4ZWAGK-ODI IOPAMIDOL (4ZWAGK-ODI) INJECTION 76%

[Series 5: thins · axial · 0.66mm/px · z∈[+1264,+1524]mm · 15 of 295 slices shown]
[im 18/295  lung]
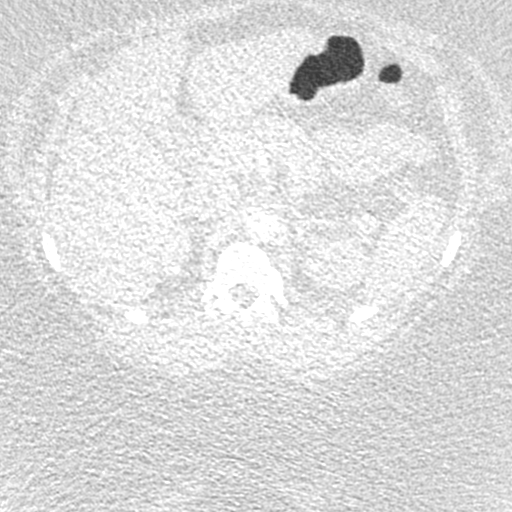
[im 35/295  soft-tissue]
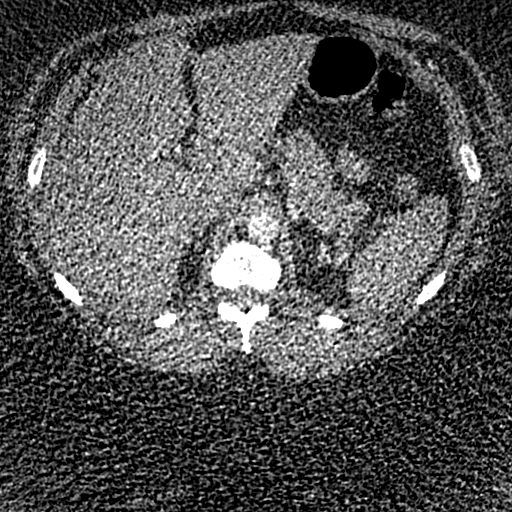
[im 52/295  lung]
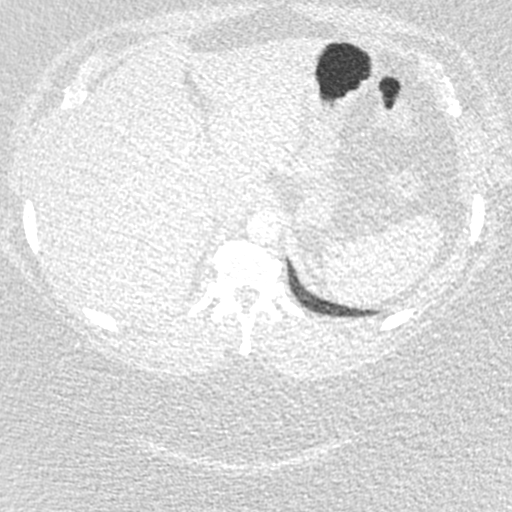
[im 70/295  soft-tissue]
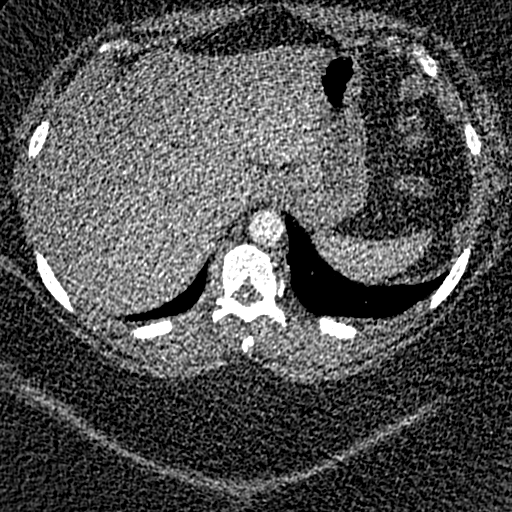
[im 87/295  lung]
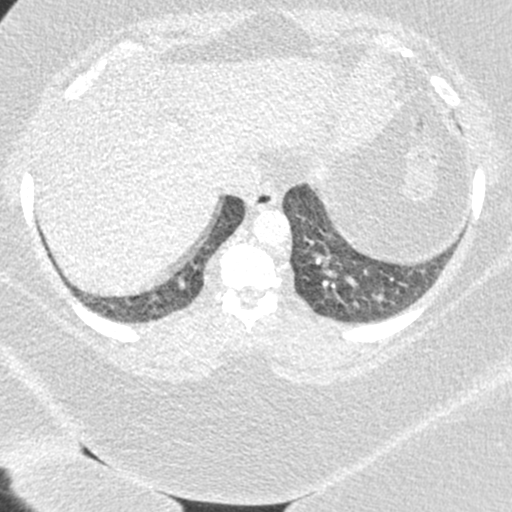
[im 104/295  soft-tissue]
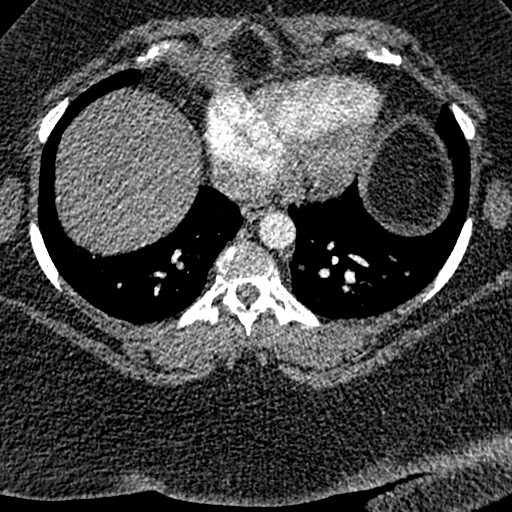
[im 122/295  lung]
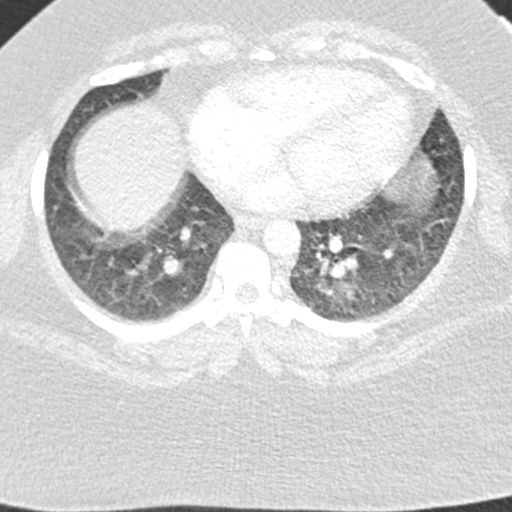
[im 156/295  soft-tissue]
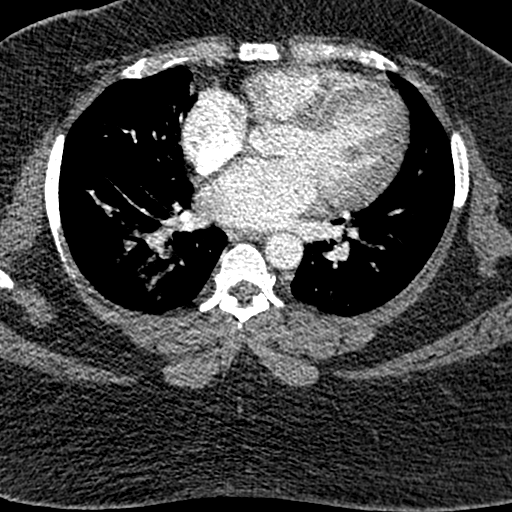
[im 173/295  lung]
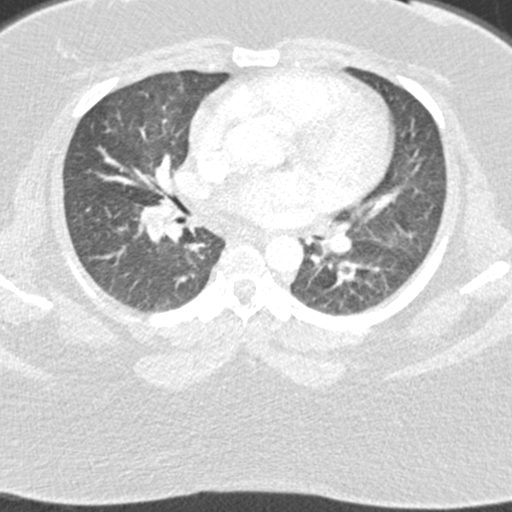
[im 191/295  soft-tissue]
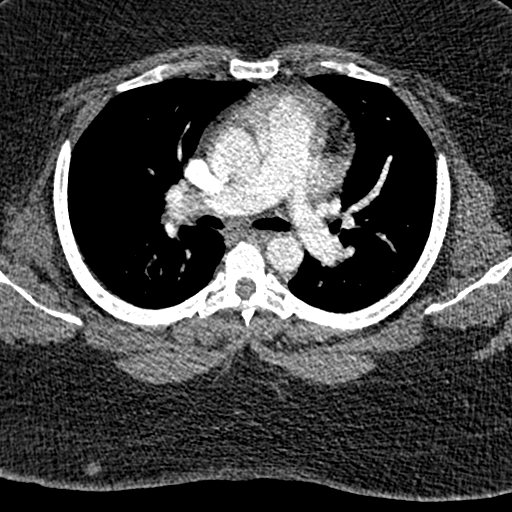
[im 208/295  lung]
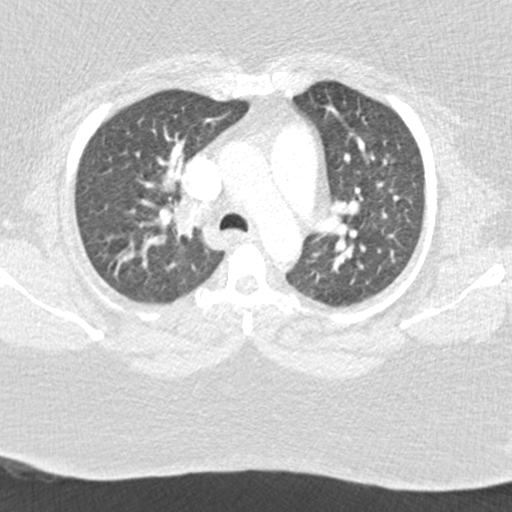
[im 225/295  soft-tissue]
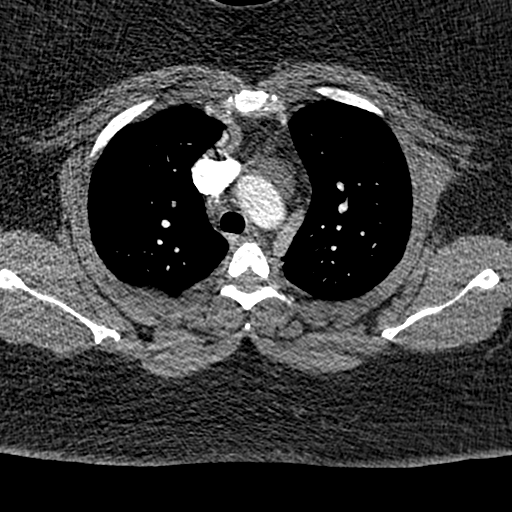
[im 243/295  lung]
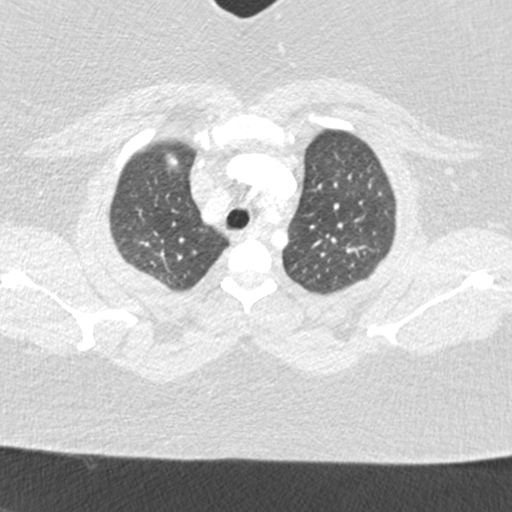
[im 260/295  soft-tissue]
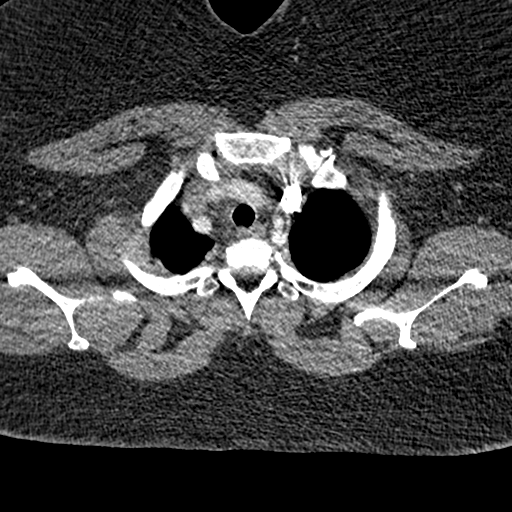
[im 277/295  lung]
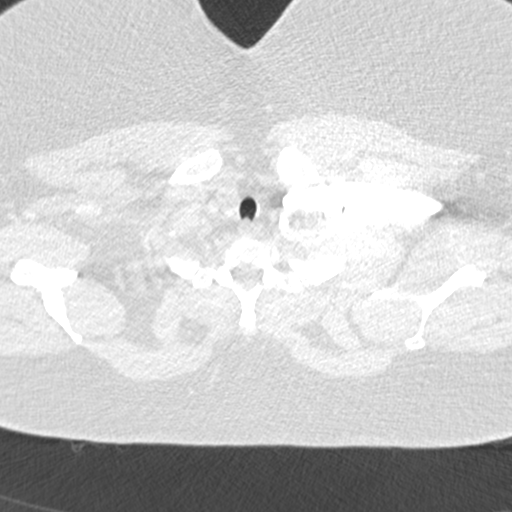

[Series 7: coronal mpr · coronal · 0.60mm/px · 3 of 178 slices shown]
[im 45/178  soft-tissue]
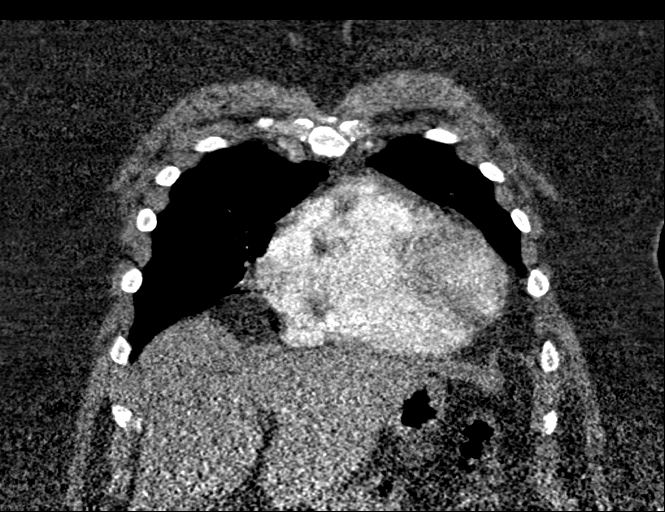
[im 89/178  soft-tissue]
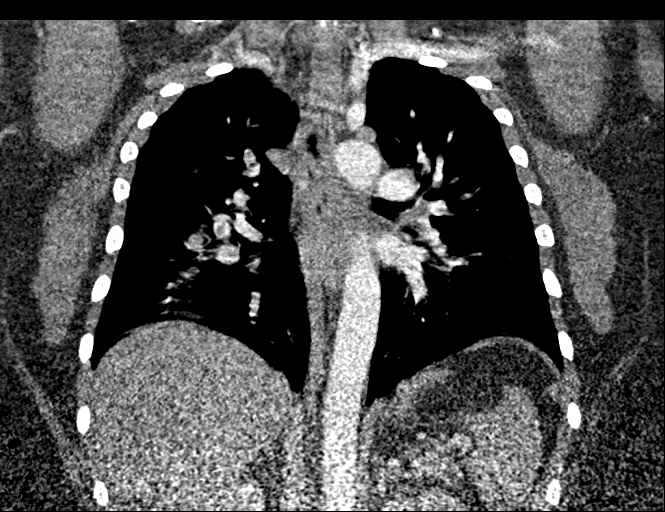
[im 133/178  soft-tissue]
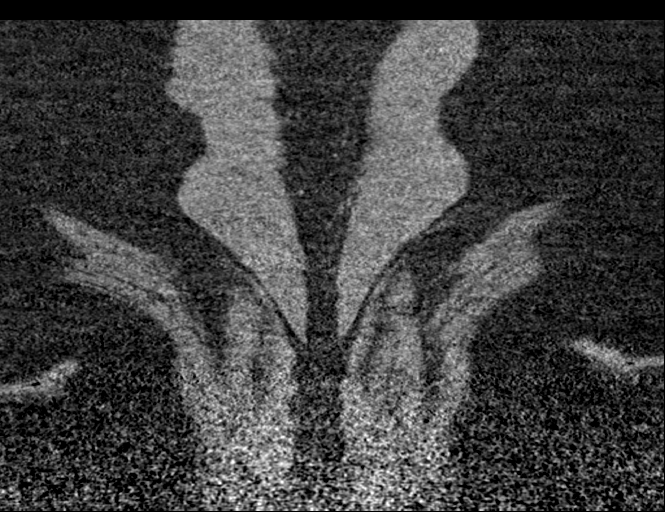

[18 of 46 positions shown; findings below may reference images not displayed]

FINDINGS: Cardiovascular: Satisfactory opacification of the pulmonary arteries
to the segmental level. Slightly grainy appearance of the images due
to large body habitus. Multiple filling defects within the distal
right pulmonary artery, the right inter lobar pulmonary artery, and
multiple segmental and subsegmental right upper and lower lobe
pulmonary vessels. Small emboli also suspected within lingular and
left lower lobe subsegmental pulmonary arterial vessels. RV LV ratio
is non elevated. Mild cardiomegaly. No pericardial effusion.
Nonaneurysmal aorta

Mediastinum/Nodes: No enlarged mediastinal, hilar, or axillary lymph
nodes. Thyroid gland, trachea, and esophagus demonstrate no
significant findings.

Lungs/Pleura: No pleural effusion or pneumothorax. Minimal
ground-glass foci within the right greater than left lung base.

Upper Abdomen: No acute abnormality.

Musculoskeletal: No chest wall abnormality. No acute or significant
osseous findings.

Review of the MIP images confirms the above findings.
IMPRESSION: 1. Positive for acute bilateral right greater than left pulmonary
emboli. No evidence for right heart strain
2. Small foci of ground-glass density in the posterior right greater
than left lower lobes, may reflect small foci of infection, or
possible small pulmonary infarcts

Critical Value/emergent results were called by telephone at the time
of interpretation on 03/03/2018 at [DATE] to Dr. CHAI TIGER , who
verbally acknowledged these results.

## 2020-01-01 IMAGING — CR DG CHEST 2V
2 series · 2 of 2 positions shown · non-contrast
Comparison: None

CLINICAL DATA: Shortness of breath and chest pain for several days,
history DVT

EXAM:
CHEST - 2 VIEW

[w chest lat]
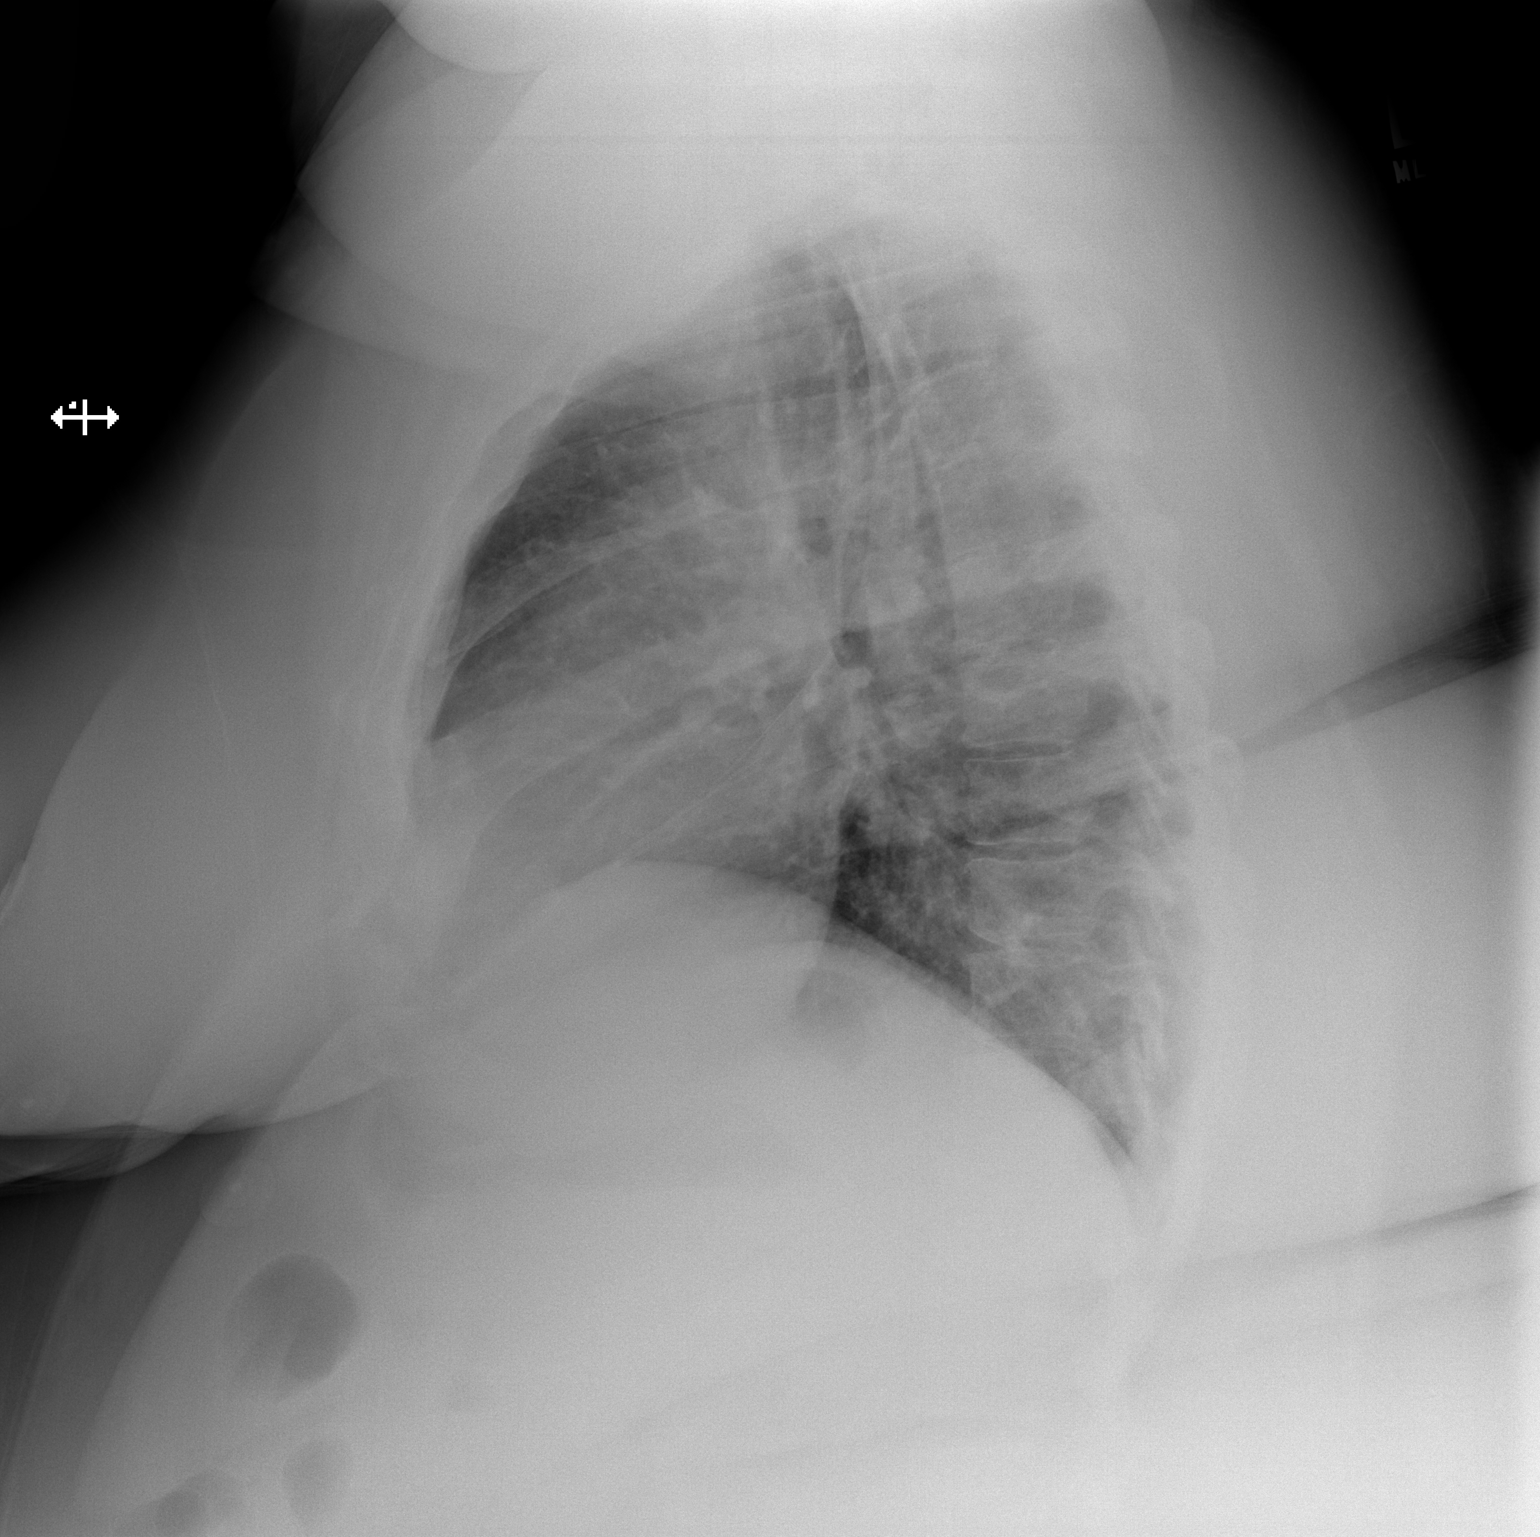

[w chest pa]
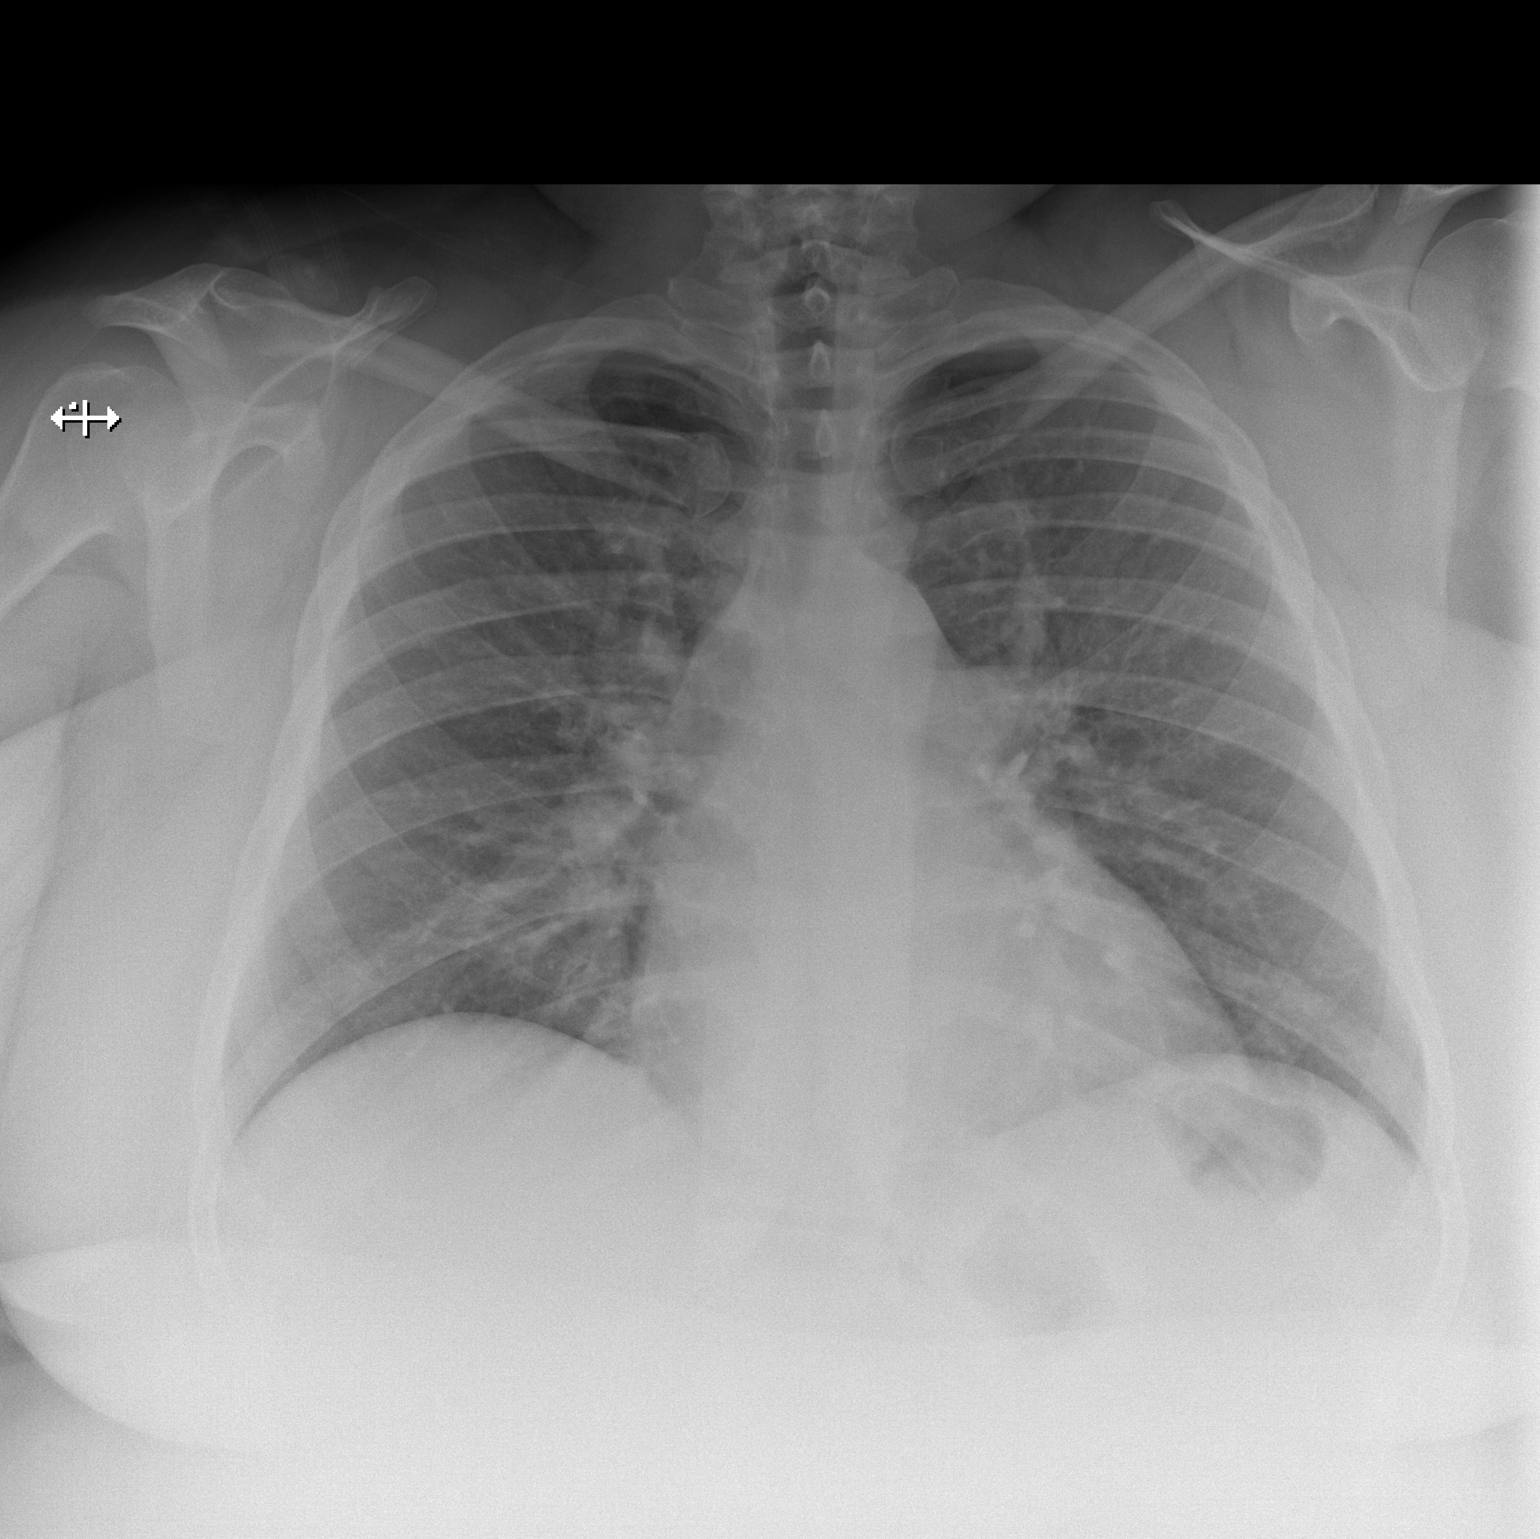

[2 of 2 positions shown; findings below may reference images not displayed]

FINDINGS: Enlargement of cardiac silhouette with pulmonary vascular
congestion.

Lungs grossly clear.

No definite acute infiltrate, pleural effusion or pneumothorax.

Bones unremarkable.
IMPRESSION: Enlargement of cardiac silhouette with pulmonary vascular
congestion.

No definite acute infiltrate.

## 2020-06-28 DIAGNOSIS — Z03818 Encounter for observation for suspected exposure to other biological agents ruled out: Secondary | ICD-10-CM | POA: Diagnosis not present

## 2020-06-28 DIAGNOSIS — Z20822 Contact with and (suspected) exposure to covid-19: Secondary | ICD-10-CM | POA: Diagnosis not present

## 2020-08-24 DIAGNOSIS — G478 Other sleep disorders: Secondary | ICD-10-CM | POA: Diagnosis not present

## 2020-08-24 DIAGNOSIS — F4321 Adjustment disorder with depressed mood: Secondary | ICD-10-CM | POA: Diagnosis not present

## 2020-08-24 DIAGNOSIS — F329 Major depressive disorder, single episode, unspecified: Secondary | ICD-10-CM | POA: Diagnosis not present

## 2020-09-07 DIAGNOSIS — J329 Chronic sinusitis, unspecified: Secondary | ICD-10-CM | POA: Diagnosis not present

## 2020-09-07 DIAGNOSIS — Z86718 Personal history of other venous thrombosis and embolism: Secondary | ICD-10-CM | POA: Diagnosis not present

## 2020-09-07 DIAGNOSIS — H9203 Otalgia, bilateral: Secondary | ICD-10-CM | POA: Diagnosis not present

## 2020-09-07 DIAGNOSIS — J310 Chronic rhinitis: Secondary | ICD-10-CM | POA: Diagnosis not present

## 2020-11-21 DIAGNOSIS — R7303 Prediabetes: Secondary | ICD-10-CM | POA: Diagnosis not present

## 2020-11-21 DIAGNOSIS — E782 Mixed hyperlipidemia: Secondary | ICD-10-CM | POA: Diagnosis not present

## 2020-11-21 DIAGNOSIS — E559 Vitamin D deficiency, unspecified: Secondary | ICD-10-CM | POA: Diagnosis not present

## 2020-11-21 DIAGNOSIS — R635 Abnormal weight gain: Secondary | ICD-10-CM | POA: Diagnosis not present

## 2020-11-21 DIAGNOSIS — N951 Menopausal and female climacteric states: Secondary | ICD-10-CM | POA: Diagnosis not present

## 2020-11-21 DIAGNOSIS — E038 Other specified hypothyroidism: Secondary | ICD-10-CM | POA: Diagnosis not present

## 2020-11-27 DIAGNOSIS — Z1339 Encounter for screening examination for other mental health and behavioral disorders: Secondary | ICD-10-CM | POA: Diagnosis not present

## 2020-11-27 DIAGNOSIS — R7303 Prediabetes: Secondary | ICD-10-CM | POA: Diagnosis not present

## 2020-11-27 DIAGNOSIS — E782 Mixed hyperlipidemia: Secondary | ICD-10-CM | POA: Diagnosis not present

## 2020-11-27 DIAGNOSIS — E559 Vitamin D deficiency, unspecified: Secondary | ICD-10-CM | POA: Diagnosis not present

## 2020-11-27 DIAGNOSIS — N951 Menopausal and female climacteric states: Secondary | ICD-10-CM | POA: Diagnosis not present

## 2020-11-27 DIAGNOSIS — Z1331 Encounter for screening for depression: Secondary | ICD-10-CM | POA: Diagnosis not present

## 2020-11-28 DIAGNOSIS — Z03818 Encounter for observation for suspected exposure to other biological agents ruled out: Secondary | ICD-10-CM | POA: Diagnosis not present

## 2023-10-15 ENCOUNTER — Other Ambulatory Visit: Payer: Self-pay | Admitting: Obstetrics and Gynecology

## 2023-10-17 LAB — SURGICAL PATHOLOGY

## 2024-02-23 DIAGNOSIS — Z0289 Encounter for other administrative examinations: Secondary | ICD-10-CM

## 2024-02-24 ENCOUNTER — Ambulatory Visit: Payer: No Typology Code available for payment source | Admitting: Family Medicine

## 2024-02-24 ENCOUNTER — Encounter: Payer: Self-pay | Admitting: Family Medicine

## 2024-02-24 VITALS — BP 139/87 | HR 69 | Ht 66.0 in | Wt >= 6400 oz

## 2024-02-24 DIAGNOSIS — Z6841 Body Mass Index (BMI) 40.0 and over, adult: Secondary | ICD-10-CM | POA: Diagnosis not present

## 2024-02-24 DIAGNOSIS — I824Y9 Acute embolism and thrombosis of unspecified deep veins of unspecified proximal lower extremity: Secondary | ICD-10-CM

## 2024-02-24 DIAGNOSIS — R7303 Prediabetes: Secondary | ICD-10-CM

## 2024-02-24 DIAGNOSIS — E66813 Obesity, class 3: Secondary | ICD-10-CM | POA: Diagnosis not present

## 2024-02-24 NOTE — Progress Notes (Signed)
 Office: [phone]  /  Fax: [phone]   Initial Visit  Catherine Armstrong was seen in clinic today to evaluate for obesity. She is interested in losing weight to improve overall health and reduce the risk of weight related complications. She presents today to review program treatment options, initial physical assessment, and evaluation.     She was referred by: Friend or Family  When asked what else they would like to accomplish? She states: Improve energy levels and physical activity, Improve existing medical conditions, Improve quality of life, and Improve appearance  Weight history:  max weight 500 lb.  Has lost weight on her own.  Gained weight since having son (49 yo).  Has been overweight since childhood.  Widowed since COVID.    When asked how has your weight affected you? She states: Contributed to medical problems and Contributed to orthopedic problems or mobility issues  Some associated conditions: Arthritis:knees, Prediabetes, and Venous thromboembolism  Contributing factors: Family history of obesity, Moderate to high levels of stress, and Eating patterns  Weight promoting medications identified: Contraceptives or hormonal therapy  Current nutrition plan: None  Current level of physical activity: Walking 15-30 minutes  Current or previous pharmacotherapy: GLP-1 + GIP  Response to medication:  losing weight on Mounjaro   Past medical history includes:   Past Medical History:  Diagnosis Date   DVT (deep venous thrombosis) (HCC) 2016   left leg   Obesity      Objective:   BP 139/87   Pulse 69   Ht 5\' 6"  (1.676 m)   Wt (!) 427 lb (193.7 kg)   SpO2 97%   BMI 68.92 kg/m  She was weighed on the bioimpedance scale: Body mass index is 68.92 kg/m.  Peak Weight:550 , Body Fat%:66.9, Visceral Fat Rating:33, Weight trend over the last 12 months: Decreasing  General:  Alert, oriented and cooperative. Patient is in no acute distress.  Respiratory: Normal  respiratory effort, no problems with respiration noted   Gait: able to ambulate independently  Mental Status: Normal mood and affect. Normal behavior. Normal judgment and thought content.   DIAGNOSTIC DATA REVIEWED:  BMET    Component Value Date/Time   NA 142 03/03/2018 1701   K 3.7 03/03/2018 1701   CL 106 03/03/2018 1701   CO2 27 03/03/2018 1701   GLUCOSE 91 03/03/2018 1701   BUN 13 03/03/2018 1701   CREATININE 0.77 03/03/2018 1701   CALCIUM 9.3 03/03/2018 1701   GFRNONAA >60 03/03/2018 1701   GFRAA >60 03/03/2018 1701   No results found for: "HGBA1C" No results found for: "INSULIN" CBC    Component Value Date/Time   WBC 10.1 03/04/2018 0237   RBC 3.76 (L) 03/04/2018 0237   HGB 10.6 (L) 03/04/2018 0237   HCT 33.4 (L) 03/04/2018 0237   PLT 273 03/04/2018 0237   MCV 88.8 03/04/2018 0237   MCH 28.2 03/04/2018 0237   MCHC 31.7 03/04/2018 0237   RDW 16.2 (H) 03/04/2018 0237   Iron/TIBC/Ferritin/ %Sat No results found for: "IRON", "TIBC", "FERRITIN", "IRONPCTSAT" Lipid Panel  No results found for: "CHOL", "TRIG", "HDL", "CHOLHDL", "VLDL", "LDLCALC", "LDLDIRECT" Hepatic Function Panel  No results found for: "PROT", "ALBUMIN", "AST", "ALT", "ALKPHOS", "BILITOT", "BILIDIR", "IBILI" No results found for: "TSH"   Assessment and Plan:   Prediabetes She reports a history of prediabetes, taking Mounjaro off label by her PCP.  She has seen good weight reduction over time.  She is actively working on dietary changes.  Will plan to  update labs at her next visit and begin a prescribed diet  Class 3 severe obesity due to excess calories with serious comorbidity and body mass index (BMI) of 60.0 to 69.9 in adult [facility]) Reviewed weight history, program information.  Patient is actively ready to begin medically supervised weight management will set up fasting labs, fasting IC and first patient visit  Deep vein thrombosis (DVT) of proximal lower extremity, unspecified chronicity,  unspecified laterality (HCC) She has a history of both DVT and PE, requiring Xarelto 20 mg once daily, managed by hematology. She was told to lose weight to reduce her history for future blood clots.       Obesity Treatment / Action Plan:  Patient will work on garnering support from family and friends to begin weight loss journey. Will work on eliminating or reducing the presence of highly palatable, calorie dense foods in the home. Will complete provided nutritional and psychosocial assessment questionnaire before the next appointment. Will be scheduled for indirect calorimetry to determine resting energy expenditure in a fasting state.  This will allow [place] to create a reduced calorie, high-protein meal plan to promote loss of fat mass while preserving muscle mass. Will think about ideas on how to incorporate physical activity into their daily routine. Counseled on the health benefits of losing 5%-15% of total body weight. Was counseled on nutritional approaches to weight loss and benefits of reducing processed foods and consuming plant-based foods and high quality protein as part of nutritional weight management. Was counseled on pharmacotherapy and role as an adjunct in weight management.   Obesity Education Performed Today:  She was weighed on the bioimpedance scale and results were discussed and documented in the synopsis.  We discussed obesity as a disease and the importance of a more detailed evaluation of all the factors contributing to the disease.  We discussed the importance of long term lifestyle changes which include nutrition, exercise and behavioral modifications as well as the importance of customizing this to her specific health and social needs.  We discussed the benefits of reaching a healthier weight to alleviate the symptoms of existing conditions and reduce the risks of the biomechanical, metabolic and psychological effects of obesity.  Catherine Armstrong appears to be  in the action stage of change and states they are ready to start intensive lifestyle modifications and behavioral modifications.  20 minutes was spent today on this visit including the above counseling, pre-visit chart review, and post-visit documentation.  Reviewed by clinician on day of visit: allergies, medications, problem list, medical history, surgical history, family history, social history, and previous encounter notes pertinent to obesity diagnosis.    Seymour Bars, D.O. DABFM, [facility] [address] [place], [place] [address] [phone]

## 2024-03-04 ENCOUNTER — Encounter: Payer: Self-pay | Admitting: Family Medicine

## 2024-03-04 ENCOUNTER — Ambulatory Visit: Payer: No Typology Code available for payment source | Admitting: Family Medicine

## 2024-03-04 VITALS — BP 123/78 | HR 94 | Temp 97.6°F | Ht 66.0 in | Wt >= 6400 oz

## 2024-03-04 DIAGNOSIS — R5383 Other fatigue: Secondary | ICD-10-CM

## 2024-03-04 DIAGNOSIS — E66813 Obesity, class 3: Secondary | ICD-10-CM

## 2024-03-04 DIAGNOSIS — M170 Bilateral primary osteoarthritis of knee: Secondary | ICD-10-CM

## 2024-03-04 DIAGNOSIS — Z6841 Body Mass Index (BMI) 40.0 and over, adult: Secondary | ICD-10-CM

## 2024-03-04 DIAGNOSIS — G4730 Sleep apnea, unspecified: Secondary | ICD-10-CM | POA: Diagnosis not present

## 2024-03-04 DIAGNOSIS — R0602 Shortness of breath: Secondary | ICD-10-CM | POA: Diagnosis not present

## 2024-03-04 DIAGNOSIS — Z1331 Encounter for screening for depression: Secondary | ICD-10-CM | POA: Diagnosis not present

## 2024-03-04 DIAGNOSIS — R7303 Prediabetes: Secondary | ICD-10-CM

## 2024-03-04 DIAGNOSIS — I8290 Acute embolism and thrombosis of unspecified vein: Secondary | ICD-10-CM

## 2024-03-04 NOTE — Progress Notes (Signed)
 At a Glance:  Vitals Temp: 97.6 F (36.4 C) BP: 123/78 Pulse Rate: 94 SpO2: 97 %   Anthropometric Measurements Height: 5\' 6"  (1.676 m) Weight: (!) 427 lb (193.7 kg) BMI (Calculated): 68.95 Starting Weight: 427lb Peak Weight: 550lb   Body Composition  Body Fat %: 67.3 % Fat Mass (lbs): 287.6 lbs Muscle Mass (lbs): 132.6 lbs Visceral Fat Rating : 33   Other Clinical Data RMR: 2707 Fasting: Yes Labs: Yes Today's Visit #: 1 Starting Date: 03/04/24    EKG: Normal sinus rhythm, rate 94.  Indirect Calorimeter completed today shows a VO2 of 393 and a REE of 2707.  Her calculated basal metabolic rate is 1610 thus her basal metabolic rate is better than expected.  Chief Complaint:  Obesity   Subjective:  Catherine Armstrong (MR# [id number]) is a 48 y.o. female who presents for evaluation and treatment of obesity and related comorbidities.   Catherine Armstrong is currently in the action stage of change and ready to dedicate time achieving and maintaining a healthier weight. Catherine Armstrong is interested in becoming our patient and working on intensive lifestyle modifications including (but not limited to) diet and exercise for weight loss.  Catherine Armstrong has been struggling with her weight. She has been unsuccessful in either losing weight, maintaining weight loss, or reaching her healthy weight goal.  She has been overweight since childhood with a positive family history for obesity.  She gained more weight after becoming widowed.  She has a 28 year old son and a 27 year old nephew living at home with her.  She works full-time for [employer] in a sedentary job.  She averages 1000 steps per day.  She has been in the habit of skipping breakfast, over snacking at night and consuming excess amounts of sugar.  Catherine Armstrong's habits were reviewed today and are as follows: her desired weight loss is over 100 lb, she has been heavy most of her life, she has significant food cravings issues, she snacks frequently in the  evenings, she skips meals frequently, she is frequently drinking liquids with calories, she frequently makes poor food choices, she frequently eats larger portions than normal, and she struggles with emotional eating.   Other Fatigue Catherine Armstrong admits to daytime somnolence and admits to waking up still tired. Patient has a history of symptoms of morning fatigue. Catherine Armstrong generally gets 6 hours of sleep per night, and states that she has generally restful sleep. Snoring is present. Apneic episodes are not present. Epworth Sleepiness Score is 10.   Shortness of Breath Catherine Armstrong notes increasing shortness of breath with exercising and seems to be worsening over time with weight gain. She notes getting out of breath sooner with activity than she used to. This has gotten worse recently. Catherine Armstrong denies shortness of breath at rest or orthopnea.   Depression Screen Catherine Armstrong's Food and Mood (modified PHQ-9) score was 11.      No data to display           Assessment and Plan:   Other Fatigue Catherine Armstrong does feel that her weight is causing her energy to be lower than it should be. Fatigue may be related to obesity, depression or many other causes. Labs will be ordered, and in the meanwhile, Catherine Armstrong will focus on self care including making healthy food choices, increasing physical activity and focusing on stress reduction.  Shortness of Breath Catherine Armstrong does feel that she gets out of breath more easily that she used to when she exercises. Catherine Armstrong's shortness of breath appears to be obesity  related and exercise induced. She has agreed to work on weight loss and gradually increase exercise to treat her exercise induced shortness of breath. Will continue to monitor closely.  Catherine Armstrong had a positive depression screening. Depression is commonly associated with obesity and often results in emotional eating behaviors. We will monitor this closely and work on CBT to help improve the non-hunger eating patterns. Referral to Psychology may be  required if no improvement is seen as she continues in our clinic.    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 Pamelia Hoit.  She is high risk for VTE with a history of preeclampsia, morbid obesity.  Begin active plan for weight reduction.  Continue Xarelto per hematology.   Relevant Medications   XARELTO 10 MG TABS tablet   Primary osteoarthritis of both knees Managed by [facility], patient has bilateral knee DJD pain.  This does limit ambulation.  It has had an effect on further weight gain over time.  She is not on any prescription or over-the-counter NSAIDs.  Begin active plan for weight reduction.  Consider the addition of physical therapy, water exercises, recumbent bike.   Prediabetes No results found for: "HGBA1C" She has a history of prediabetes.  She is not on metformin.  She is taking Mounjaro off label at 12.5 mg once weekly injection by her PCP and tolerating this well but has not seen any improvement in satiety or weight reduction.  Begin prescribed dietary plan which is low in added sugar and starches.  Think about options for improving level of physical activity.  Obtain labs today.  Consider the addition of metformin.   Relevant Orders   Insulin, random   Hemoglobin A1c   Sleep-disordered breathing Epworth score elevated at 10 with reports of snoring.  With her body habitus and daytime fatigue, she appears to be high risk for OSA.  Referral to Dr. Jerre Simon made for further evaluation and treatment.  Begin active plan for weight reduction.   Relevant Orders   Ambulatory referral to Pulmonology   Other Visit Diagnoses       Other fatigue    -  Primary   Relevant Orders   EKG 12-Lead (Completed)   VITAMIN D 25 Hydroxy (Vit-D Deficiency, Fractures)   Folate   Vitamin B12     SOBOE (shortness of breath on exertion)         Class 3 severe obesity due to excess calories  with serious comorbidity and body mass index (BMI) of 60.0 to 69.9 in adult [facility])       Relevant Medications   MOUNJARO 12.5 MG/0.5ML Pen       Catherine Armstrong is currently in the action stage of change and her goal is to continue with weight loss efforts. I recommend Catherine Armstrong begin the structured treatment plan as follows:  She has agreed to Category 3 Plan + 100 additional snack calories (1600 cal/ day)  Exercise goals: All adults should avoid inactivity. Some activity is better than none, and adults who participate in any amount of physical activity, gain some health benefits.  Behavioral modification strategies:increasing lean protein intake, decreasing simple carbohydrates, increasing vegetables, increase H2O intake, decrease liquid calories, decrease ETOH, increase high fiber foods, decreasing eating out, no skipping meals, meal planning and cooking strategies, keeping healthy foods in the home, better snacking choices, and planning for success  She was informed of  the importance of frequent follow-up visits to maximize her success with intensive lifestyle modifications for her multiple health conditions. She was informed we would discuss her lab results at her next visit unless there is a critical issue that needs to be addressed sooner. Catherine Armstrong agreed to keep her next visit at the agreed upon time to discuss these results.  Objective:  General: Cooperative, alert, well developed, in no acute distress. HEENT: Conjunctivae and lids unremarkable. Cardiovascular: Regular rhythm.  Lungs: Normal work of breathing. Neurologic: No focal deficits.   Lab Results  Component Value Date   CREATININE 0.77 03/03/2018   BUN 13 03/03/2018   NA 142 03/03/2018   K 3.7 03/03/2018   CL 106 03/03/2018   CO2 27 03/03/2018   No results found for: "ALT", "AST", "GGT", "ALKPHOS", "BILITOT" No results found for: "HGBA1C" No results found for: "INSULIN" No results found for: "TSH" No results found for: "CHOL",  "HDL", "LDLCALC", "LDLDIRECT", "TRIG", "CHOLHDL" Lab Results  Component Value Date   WBC 10.1 03/04/2018   HGB 10.6 (L) 03/04/2018   HCT 33.4 (L) 03/04/2018   MCV 88.8 03/04/2018   PLT 273 03/04/2018   No results found for: "IRON", "TIBC", "FERRITIN"  Attestation Statements:  Reviewed by clinician on day of visit: allergies, medications, problem list, medical history, surgical history, family history, social history, and previous encounter notes.  Time spent on visit including pre-visit chart review and post-visit charting and care was 47 minutes.   Glennis Brink, DO

## 2024-03-05 LAB — FOLATE: Folate: 15.8 ng/mL (ref >3.0)

## 2024-03-05 LAB — HEMOGLOBIN A1C
Est. average glucose Bld gHb Est-mCnc: 120 mg/dL
Hgb A1c MFr Bld: 5.8 % — ABNORMAL HIGH (ref 4.8–5.6)

## 2024-03-05 LAB — VITAMIN D 25 HYDROXY (VIT D DEFICIENCY, FRACTURES): Vit D, 25-Hydroxy: 56.6 ng/mL (ref 30.0–100.0)

## 2024-03-05 LAB — VITAMIN B12: Vitamin B-12: 884 pg/mL (ref 232–1245)

## 2024-03-05 LAB — INSULIN, RANDOM: INSULIN: 6.6 u[IU]/mL (ref 2.6–24.9)

## 2024-03-09 ENCOUNTER — Telehealth: Payer: Self-pay | Admitting: *Deleted

## 2024-03-09 NOTE — Telephone Encounter (Signed)
 [facility] to schedule with Dr. Frann Rider. [phone]

## 2024-03-18 ENCOUNTER — Ambulatory Visit: Payer: No Typology Code available for payment source | Admitting: Family Medicine

## 2024-03-18 ENCOUNTER — Encounter: Payer: Self-pay | Admitting: Family Medicine

## 2024-03-18 VITALS — BP 127/81 | HR 81 | Temp 98.4°F | Ht 66.0 in | Wt >= 6400 oz

## 2024-03-18 DIAGNOSIS — Z6841 Body Mass Index (BMI) 40.0 and over, adult: Secondary | ICD-10-CM

## 2024-03-18 DIAGNOSIS — E66813 Obesity, class 3: Secondary | ICD-10-CM

## 2024-03-18 DIAGNOSIS — M170 Bilateral primary osteoarthritis of knee: Secondary | ICD-10-CM | POA: Diagnosis not present

## 2024-03-18 DIAGNOSIS — G4730 Sleep apnea, unspecified: Secondary | ICD-10-CM | POA: Diagnosis not present

## 2024-03-18 DIAGNOSIS — R7303 Prediabetes: Secondary | ICD-10-CM

## 2024-03-18 NOTE — Progress Notes (Signed)
 Office: [phone]  /  Fax: [phone]  WEIGHT SUMMARY AND BIOMETRICS  Starting Date: 03/04/24  Starting Weight: 427lb   Weight Lost Since Last Visit: 3lb   Vitals Temp: 98.4 F (36.9 C) BP: 127/81 Pulse Rate: 81 SpO2: 99 %   Body Composition  Body Fat %: 67.2 % Fat Mass (lbs): 285.4 lbs Muscle Mass (lbs): 132.2 lbs Visceral Fat Rating : 33    HPI  Chief Complaint: OBESITY  Catherine Armstrong is here to discuss her progress with her obesity treatment plan. She is on the the Category 3 Plan and states she is following her eating plan approximately 80 % of the time. She states she is exercising 60+ minutes 3 times per week.  Interval History:  Since last office visit she is down 3 lb She is doing well with food choices and meal planning, eating on plan She feels adequately full at the end of her meals She is doing well with drinking water Her son is supportive PSG getting scheduled She is swimming laps at home in a heated pool 3 x a week She has had some sugar cravings late at night but has stopped leaving candy in her room  Pharmacotherapy: Mounjaro 12.5 mg weekly (tolerating well, has to pick up 15 mg sent in by PCP)  PHYSICAL EXAM:  Blood pressure 127/81, pulse 81, temperature 98.4 F (36.9 C), height 5\' 6"  (1.676 m), weight (!) 424 lb (192.3 kg), SpO2 99%. Body mass index is 68.44 kg/m.  General: She is overweight, cooperative, alert, well developed, and in no acute distress. PSYCH: Has normal mood, affect and thought process.   Lungs: Normal breathing effort, no conversational dyspnea.   ASSESSMENT AND PLAN  TREATMENT PLAN FOR OBESITY:  Recommended Dietary Goals  Catherine Armstrong is currently in the action stage of change. As such, her goal is to continue weight management plan. She has agreed to the Category 3 Plan.+ 100 extra snack calories Continue to keep snacks out of bedroom Brush teeth at bedtime and will continue to work on mindfulness late at night OK to  keep water in bedroom  Behavioral Intervention  We discussed the following Behavioral Modification Strategies today: increasing lean protein intake to established goals, increasing fiber rich foods, increasing water intake , work on meal planning and preparation, reading food labels , keeping healthy foods at home, identifying sources and decreasing liquid calories, decreasing eating out or consumption of processed foods, and making healthy choices when eating convenient foods, avoiding temptations and identifying enticing environmental cues, planning for success, and continue to work on maintaining a reduced calorie state, getting the recommended amount of protein, incorporating whole foods, making healthy choices, staying well hydrated and practicing mindfulness when eating..  Additional resources provided today: NA  Recommended Physical Activity Goals  [place] has been advised to work up to 150 minutes of moderate intensity aerobic activity a week and strengthening exercises 2-3 times per week for cardiovascular health, weight loss maintenance and preservation of muscle mass.   She has agreed to Increase the intensity, frequency or duration of aerobic exercises   Keep up swimming 3 days/ wk Add a carb to preworkout snack (banana or rice cake with peanut butter)  Pharmacotherapy changes for the treatment of obesity: none  ASSOCIATED CONDITIONS ADDRESSED TODAY  Prediabetes She will be moving up on her Mounjaro next week to 15 mg once weekly injection (thru PCP) She has tolerated Mounjaro well but has not seen much improvements in satiety or weight loss since starting  A1c have all been <6.5 in the prediabetic range Reviewed lab results with patient from last visit at 5.9  Continue to work on prescribed meals plan, reducing added sugar and reading labels on food and drink Continue active plan for weight reduction Continue to work on regular exercise 3+ days/ wk  Class 3 severe obesity due  to excess calories with serious comorbidity and body mass index (BMI) of 60.0 to 69.9 in adult [facility]) Consider bariatric surgery over the next 6 mos if patient is not able to see >5-10% of her TBW lost Counseled patient about Obesity Medicine vs RD (our clinic is Obesity medicine and we do not employ RD's at present time)  Primary osteoarthritis of both knees Doing well with water exercise DJD knees continues to limit walking time which impacts her ability to lose weight F/u with ortho and continue active plan for BMI reduction  Sleep-disordered breathing Notes reviewed from Dr Jerre Simon from yesterday Planning polysomnogram     She was informed of the importance of frequent follow up visits to maximize her success with intensive lifestyle modifications for her multiple health conditions.   ATTESTASTION STATEMENTS:  Reviewed by clinician on day of visit: allergies, medications, problem list, medical history, surgical history, family history, social history, and previous encounter notes pertinent to obesity diagnosis.   I have personally spent 30 minutes total time today in preparation, patient care, nutritional counseling and documentation for this visit, including the following: review of clinical lab tests; review of medical tests/procedures/services.      Glennis Brink, DO DABFM, DABOM [facility] [address] [place], [place] [address] [phone]

## 2024-04-07 ENCOUNTER — Ambulatory Visit: Admitting: Family Medicine

## 2024-04-07 VITALS — BP 129/80 | HR 89 | Ht 66.0 in | Wt >= 6400 oz

## 2024-04-07 DIAGNOSIS — G4730 Sleep apnea, unspecified: Secondary | ICD-10-CM | POA: Diagnosis not present

## 2024-04-07 DIAGNOSIS — R632 Polyphagia: Secondary | ICD-10-CM

## 2024-04-07 DIAGNOSIS — E66813 Obesity, class 3: Secondary | ICD-10-CM

## 2024-04-07 DIAGNOSIS — M170 Bilateral primary osteoarthritis of knee: Secondary | ICD-10-CM

## 2024-04-07 DIAGNOSIS — Z6841 Body Mass Index (BMI) 40.0 and over, adult: Secondary | ICD-10-CM

## 2024-04-07 DIAGNOSIS — R7303 Prediabetes: Secondary | ICD-10-CM | POA: Diagnosis not present

## 2024-04-07 MED ORDER — TOPIRAMATE 25 MG PO TABS: 25.0000 mg | ORAL_TABLET | Freq: Every day | ORAL | 0 refills | Status: DC

## 2024-04-07 MED ORDER — TIRZEPATIDE 15 MG/0.5ML ~~LOC~~ SOAJ: 15.0000 mg | SUBCUTANEOUS | Status: DC

## 2024-04-07 NOTE — Progress Notes (Signed)
 Office: [phone]  /  Fax: [phone]  WEIGHT SUMMARY AND BIOMETRICS  Starting Date: 03/04/24  Starting Weight: 427lb   Weight Lost Since Last Visit: 3lb   Vitals BP: 129/80 Pulse Rate: 89 SpO2: 98 %   Body Composition  Body Fat %: 66.3 % Fat Mass (lbs): 279.6 lbs Muscle Mass (lbs): 134.8 lbs Visceral Fat Rating : 32     HPI  Chief Complaint: OBESITY  Catherine Armstrong is here to discuss her progress with her obesity treatment plan. She is on the the Category 3 Plan and states she is following her eating plan approximately 85 % of the time. She states she is exercising 30 minutes 3 times per week.   Interval History:  Since last office visit she is down 3 lb This gives her a net weight loss of 6 lb in 1 month She did  travel to [place] and stayed mindful of her food choices She is scheduled for a sleep study with Dr Jerre Simon She is working on her meal plan and meal planning/ grocery shopping She is using dining out guide Walking less due to R knee pain - seeing [facility] back Plans to resume water exercise several days/ wk She is working on keeping snacks out of her room Her son has been supportive  Pharmacotherapy: Mounjaro 15 mg weekly by PCP  PHYSICAL EXAM:  Blood pressure 129/80, pulse 89, height 5\' 6"  (1.676 m), weight (!) 421 lb (191 kg), SpO2 98%. Body mass index is 67.95 kg/m.  General: She is overweight, cooperative, alert, well developed, and in no acute distress. PSYCH: Has normal mood, affect and thought process.   Lungs: Normal breathing effort, no conversational dyspnea.   ASSESSMENT AND PLAN  TREATMENT PLAN FOR OBESITY:  Recommended Dietary Goals  Catherine Armstrong is currently in the action stage of change. As such, her goal is to continue weight management plan. She has agreed to the Category 3 Plan.  Behavioral Intervention  We discussed the following Behavioral Modification Strategies today: increasing lean protein intake to established goals,  decreasing simple carbohydrates , increasing fiber rich foods, increasing water intake , work on meal planning and preparation, keeping healthy foods at home, identifying sources and decreasing liquid calories, practice mindfulness eating and understand the difference between hunger signals and cravings, work on managing stress, creating time for self-care and relaxation, avoiding temptations and identifying enticing environmental cues, continue to practice mindfulness when eating, planning for success, and continue to work on maintaining a reduced calorie state, getting the recommended amount of protein, incorporating whole foods, making healthy choices, staying well hydrated and practicing mindfulness when eating..  Additional resources provided today: NA  Recommended Physical Activity Goals  Catherine Armstrong has been advised to work up to 150 minutes of moderate intensity aerobic activity a week and strengthening exercises 2-3 times per week for cardiovascular health, weight loss maintenance and preservation of muscle mass.   She has agreed to Think about enjoyable ways to increase daily physical activity and overcoming barriers to exercise and Increase physical activity in their day and reduce sedentary time (increase NEAT). Resume water exercise 3 days/ wk  Pharmacotherapy changes for the treatment of obesity: add topamax 25 mg with dinner for food impulse control and cravings at night Avoid pregnancy Reviewed common adverse SE Denies hx of kidney stones  ASSOCIATED CONDITIONS ADDRESSED TODAY  Polyphagia Continues to struggle at night with snacking Some hunger/ cravings improved on Mounjaro 15 mg weekly Working on reducing intake of sugar and getting in  more lean protein and fiber with meals Begin topamax 25 mg at dinner daily Keep snacks out of bedroom -     Topiramate; Take 1 tablet (25 mg total) by mouth daily.  Dispense: 30 tablet; Refill: 0  Class 3 severe obesity due to excess calories with  serious comorbidity and body mass index (BMI) of 60.0 to 69.9 in adult [facility])  Prediabetes Lab Results  Component Value Date   HGBA1C 5.8 (H) 03/04/2024   Doing well on Mounjaro 15 mg weekly without adverse SE by PCP Continue to work on weight reduction, a reduced kcal low sugar / low carb diet and regular exercise  Primary osteoarthritis of both knees Worsening R>L knee pain after trip to [place] Has limited walking which has affected weight loss progress Able to do some water exercise Has upcoming OV with [facility] to discuss corticosteroid injection which did previously help Continue active plan for weight loss  Sleep-disordered breathing  Keep upcoming visit with [facility] for sleep study  Other orders -     Tirzepatide; Inject 15 mg into the skin once a week.      She was informed of the importance of frequent follow up visits to maximize her success with intensive lifestyle modifications for her multiple health conditions.   ATTESTASTION STATEMENTS:  Reviewed by clinician on day of visit: allergies, medications, problem list, medical history, surgical history, family history, social history, and previous encounter notes pertinent to obesity diagnosis.   I have personally spent 30 minutes total time today in preparation, patient care, nutritional counseling and education,  and documentation for this visit, including the following: review of most recent clinical lab tests, prescribing medications/ refilling medications, reviewing medical assistant documentation, review and interpretation of bioimpedence results.     Seymour Bars, D.O. DABFM, DABOM [facility] [address] [place], [place] [address] [phone]

## 2024-05-06 ENCOUNTER — Ambulatory Visit: Admitting: Family Medicine

## 2024-05-10 ENCOUNTER — Encounter: Payer: Self-pay | Admitting: Family Medicine

## 2024-05-10 ENCOUNTER — Ambulatory Visit: Admitting: Family Medicine

## 2024-05-10 VITALS — BP 118/80 | HR 96 | Temp 98.0°F | Ht 66.0 in | Wt >= 6400 oz

## 2024-05-10 DIAGNOSIS — R7303 Prediabetes: Secondary | ICD-10-CM

## 2024-05-10 DIAGNOSIS — M170 Bilateral primary osteoarthritis of knee: Secondary | ICD-10-CM

## 2024-05-10 DIAGNOSIS — G4733 Obstructive sleep apnea (adult) (pediatric): Secondary | ICD-10-CM

## 2024-05-10 DIAGNOSIS — R632 Polyphagia: Secondary | ICD-10-CM | POA: Diagnosis not present

## 2024-05-10 DIAGNOSIS — Z6841 Body Mass Index (BMI) 40.0 and over, adult: Secondary | ICD-10-CM

## 2024-05-10 DIAGNOSIS — E66813 Obesity, class 3: Secondary | ICD-10-CM

## 2024-05-10 MED ORDER — TIRZEPATIDE 15 MG/0.5ML ~~LOC~~ SOAJ
15.0000 mg | SUBCUTANEOUS | 1 refills | Status: AC
Start: 2024-05-10 — End: ?

## 2024-05-10 NOTE — Progress Notes (Signed)
 Office: [phone]  /  Fax: [phone]  WEIGHT SUMMARY AND BIOMETRICS  Starting Date: 03/04/24  Starting Weight: 427lb   Weight Lost Since Last Visit: 8lb   Vitals Temp: 98 F (36.7 C) BP: 118/80 Pulse Rate: 96 SpO2: 99 %   Body Composition  Body Fat %: 68.7 % Fat Mass (lbs): 284.2 lbs Muscle Mass (lbs): 123 lbs Visceral Fat Rating : 33    HPI  Chief Complaint: OBESITY  Catherine Armstrong is here to discuss her progress with her obesity treatment plan. She is on the the Category 3 Plan and states she is following her eating plan approximately 85 % of the time. She states she is exercising 30 minutes 3 times per week.  Interval History:  Since last office visit she is down 8 lb This gives her a net weight loss of 14 lb in 2 mos She is back to water exercise 3 days/ wk She is working on mindful eating and improving food choices She used up some optivia bars She has started on CPAP nightly - just getting used to She is getting in lean protein with meals and snacks She has some improved satiety  She is bringing healthy snacks to work R knee pain is improving  Pharmacotherapy: Mounjaro 15 mg weekly Did not start Topamax  25 mg in the evenings yet  PHYSICAL EXAM:  Blood pressure 118/80, pulse 96, temperature 98 F (36.7 C), height 5\' 6"  (1.676 m), weight (!) 413 lb (187.3 kg), SpO2 99%. Body mass index is 66.66 kg/m.  General: She is overweight, cooperative, alert, well developed, and in no acute distress. PSYCH: Has normal mood, affect and thought process.   Lungs: Normal breathing effort, no conversational dyspnea.   ASSESSMENT AND PLAN  TREATMENT PLAN FOR OBESITY:  Recommended Dietary Goals  Catherine Armstrong is currently in the action stage of change. As such, her goal is to continue weight management plan. She has agreed to the Category 3 Plan.  Behavioral Intervention  We discussed the following Behavioral Modification Strategies today: increasing lean protein  intake to established goals, increasing fiber rich foods, increasing water intake , work on meal planning and preparation, keeping healthy foods at home, decreasing eating out or consumption of processed foods, and making healthy choices when eating convenient foods, practice mindfulness eating and understand the difference between hunger signals and cravings, work on managing stress, creating time for self-care and relaxation, avoiding temptations and identifying enticing environmental cues, continue to practice mindfulness when eating, planning for success, and continue to work on maintaining a reduced calorie state, getting the recommended amount of protein, incorporating whole foods, making healthy choices, staying well hydrated and practicing mindfulness when eating..  Additional resources provided today: NA  Recommended Physical Activity Goals  [name] has been advised to work up to 150 minutes of moderate intensity aerobic activity a week and strengthening exercises 2-3 times per week for cardiovascular health, weight loss maintenance and preservation of muscle mass.   She has agreed to Think about enjoyable ways to increase daily physical activity and overcoming barriers to exercise and Increase physical activity in their day and reduce sedentary time (increase NEAT). Continue water exercise 3 days / wk + short walks daily  Pharmacotherapy changes for the treatment of obesity: none  ASSOCIATED CONDITIONS ADDRESSED TODAY  Prediabetes Lab Results  Component Value Date   HGBA1C 5.8 (H) 03/04/2024  Doing well on off label use of Mounjaro 15 mg weekly Doing well on cat 3 meal plan limiting sugar and  starches Has ramped up exercise frequency and is doing well with weight reduction  -     Tirzepatide; Inject 15 mg into the skin once a week.  Dispense: 2 mL; Refill: 1  Class 3 severe obesity due to excess calories with body mass index (BMI) of 60.0 to 69.9 in adult  Primary osteoarthritis of  both knees Improving R>L knee pain is improving with water exercise and weight loss Continue active plan for weight loss  OSA on CPAP New Notes reviewed from [facility] dated 04/26/24 indicating an AHI of 35 c/w severe OSA.  Started CPAP 3 nights ago with a nasal mask, still adjusting to pressure and has not yet seen sleep improvements  Continue CPAP nightly Follow up with Sandie Cross PA for mask feedback Continue active plan for weight loss  Polyphagia Stable Has not yet started RX Topamax  for food impulse control at night  Start Topamax  25 mg in the evening around 5 pm     She was informed of the importance of frequent follow up visits to maximize her success with intensive lifestyle modifications for her multiple health conditions.   ATTESTASTION STATEMENTS:  Reviewed by clinician on day of visit: allergies, medications, problem list, medical history, surgical history, family history, social history, and previous encounter notes pertinent to obesity diagnosis.   I have personally spent 30 minutes total time today in preparation, patient care, nutritional counseling and education,  and documentation for this visit, including the following: review of most recent clinical lab tests, prescribing medications/ refilling medications, reviewing medical assistant documentation, review and interpretation of bioimpedence results.     Micky Albee, D.O. DABFM, DABOM [facility] [address] [place], [place] [address] [phone]

## 2024-05-13 ENCOUNTER — Other Ambulatory Visit: Payer: Self-pay

## 2024-05-13 DIAGNOSIS — R632 Polyphagia: Secondary | ICD-10-CM

## 2024-05-13 MED ORDER — TOPIRAMATE 25 MG PO TABS
25.0000 mg | ORAL_TABLET | Freq: Every day | ORAL | 0 refills | Status: AC
Start: 2024-05-13 — End: ?

## 2024-06-13 ENCOUNTER — Other Ambulatory Visit: Payer: Self-pay | Admitting: Family Medicine

## 2024-06-13 DIAGNOSIS — R632 Polyphagia: Secondary | ICD-10-CM

## 2024-06-14 ENCOUNTER — Encounter: Payer: Self-pay | Admitting: Family Medicine

## 2024-06-14 ENCOUNTER — Ambulatory Visit: Admitting: Family Medicine

## 2024-06-14 VITALS — BP 127/84 | HR 94 | Temp 98.0°F | Ht 66.0 in | Wt >= 6400 oz

## 2024-06-14 DIAGNOSIS — R632 Polyphagia: Secondary | ICD-10-CM

## 2024-06-14 DIAGNOSIS — E66813 Obesity, class 3: Secondary | ICD-10-CM

## 2024-06-14 DIAGNOSIS — G4733 Obstructive sleep apnea (adult) (pediatric): Secondary | ICD-10-CM

## 2024-06-14 DIAGNOSIS — Z6841 Body Mass Index (BMI) 40.0 and over, adult: Secondary | ICD-10-CM

## 2024-06-14 DIAGNOSIS — M170 Bilateral primary osteoarthritis of knee: Secondary | ICD-10-CM

## 2024-06-14 DIAGNOSIS — R7303 Prediabetes: Secondary | ICD-10-CM | POA: Diagnosis not present

## 2024-06-14 MED ORDER — TOPIRAMATE 25 MG PO TABS: 25.0000 mg | ORAL_TABLET | Freq: Two times a day (BID) | ORAL | 0 refills | Status: DC

## 2024-06-14 MED ORDER — TIRZEPATIDE 15 MG/0.5ML ~~LOC~~ SOAJ: 15.0000 mg | SUBCUTANEOUS | 1 refills | Status: DC

## 2024-06-14 NOTE — Patient Instructions (Addendum)
 Try to track daily calorie intake using the [employer] App  Aim for 1800-2000 cal/ day This should include 140 g of protein daily Don't add workouts into the app  Great job with swimming/ walking/ etc!!  Increase Topiramate  to 25 mg morning and evening -- breakfast and dinner

## 2024-06-14 NOTE — Progress Notes (Signed)
 Office: [phone]  /  Fax: [phone]  WEIGHT SUMMARY AND BIOMETRICS  Starting Date: 03/04/24  Starting Weight: 427lb   Weight Lost Since Last Visit: 3lb   Vitals Temp: 98 F (36.7 C) BP: 127/84 Pulse Rate: 94 SpO2: 97 %   Body Composition  Body Fat %: 66.9 % Fat Mass (lbs): 274.4 lbs Muscle Mass (lbs): 129 lbs Visceral Fat Rating : 32    HPI  Chief Complaint: OBESITY  Catherine Armstrong is here to discuss her progress with her obesity treatment plan. She is on the the Category 3 Plan and states she is following her eating plan approximately 75 % of the time. She states she is swimming for 60 minutes 3 times per week.  Interval History:  Since last office visit she is down 3 lb She has a net loss of 17 lb in 3 mos of medically supervised weight management This is a 3.9% TBW loss She has R>L knee pain that is improving some She is swimming 3 days/ wk and using an under the desk cycle She had graduation celebrations over the weekend She travels to the [place] next week She is trying out different CPAP masks Has good volume control from Mounjaro  15 mg but hunger has been worse in the afternoons Food impulse control at night has improved adding topiramate  25 mg in the evenings  Pharmacotherapy: Mounjaro  15 mg weekly + Topiramate  25 mg q PM  PHYSICAL EXAM:  Blood pressure 127/84, pulse 94, temperature 98 F (36.7 C), height 5' 6 (1.676 m), weight (!) 410 lb (186 kg), SpO2 97%. Body mass index is 66.18 kg/m.  General: She is overweight, cooperative, alert, well developed, and in no acute distress. PSYCH: Has normal mood, affect and thought process.   Lungs: Normal breathing effort, no conversational dyspnea.   ASSESSMENT AND PLAN  TREATMENT PLAN FOR OBESITY:  Recommended Dietary Goals  Catherine Armstrong is currently in the action stage of change. As such, her goal is to continue weight management plan. She has agreed to keeping a food journal and adhering to recommended  goals of 1800 calories and 140 g of  protein.  Behavioral Intervention  We discussed the following Behavioral Modification Strategies today: increasing lean protein intake to established goals, increasing fiber rich foods, avoiding skipping meals, increasing water intake , work on meal planning and preparation, work on Counselling psychologist calories using tracking application, keeping healthy foods at home, identifying sources and decreasing liquid calories, work on managing stress, creating time for self-care and relaxation, avoiding temptations and identifying enticing environmental cues, continue to practice mindfulness when eating, staying on track while traveling and vacationing, and continue to work on maintaining a reduced calorie state, getting the recommended amount of protein, incorporating whole foods, making healthy choices, staying well hydrated and practicing mindfulness when eating..  Additional resources provided today: NA  Recommended Physical Activity Goals  Catherine Armstrong has been advised to work up to 150 minutes of moderate intensity aerobic activity a week and strengthening exercises 2-3 times per week for cardiovascular health, weight loss maintenance and preservation of muscle mass.   She has agreed to Think about enjoyable ways to increase daily physical activity and overcoming barriers to exercise, Increase physical activity in their day and reduce sedentary time (increase NEAT)., and Increase the intensity, frequency or duration of aerobic exercises    Pharmacotherapy changes for the treatment of obesity: change topiramate  25 mg to bid   ASSOCIATED CONDITIONS ADDRESSED TODAY  Prediabetes Doing well with off label  use of Mounjaro  15 mg weekly Denies GI upset or meal skipping Working on reducing intake of sugar and starches Has done much better with regular physical activity  Continue current plan of care with exercise regimen Continue Mounjaro  15 mg weekly Avoid high  sugar ETOH drinks with upcoming vacation -     Tirzepatide ; Inject 15 mg into the skin once a week.  Dispense: 2 mL; Refill: 1  Polyphagia Improving at night on topiramate  25 mg without paresthesias or brain fog Increase to morning and evening dosing She is not at risk for pregnancy -     Topiramate ; Take 1 tablet (25 mg total) by mouth 2 (two) times daily.  Dispense: 60 tablet; Refill: 0  Class 3 severe obesity due to excess calories with body mass index (BMI) of 60.0 to 69.9 in adult  OSA on CPAP Getting used to CPAP at night, trying out different masks Continue active plan for weight loss Aim for CPAP use > 4 hrs/ night  Primary osteoarthritis of both knees Slight improvements in pain with weight loss and swimming Has been able to increase walking time Continue active plan for weight reduction Aim for short walks through the day     She was informed of the importance of frequent follow up visits to maximize her success with intensive lifestyle modifications for her multiple health conditions.   ATTESTASTION STATEMENTS:  Reviewed by clinician on day of visit: allergies, medications, problem list, medical history, surgical history, family history, social history, and previous encounter notes pertinent to obesity diagnosis.   I have personally spent 30 minutes total time today in preparation, patient care, nutritional counseling and education,  and documentation for this visit, including the following: review of most recent clinical lab tests, prescribing medications/ refilling medications, reviewing medical assistant documentation, review and interpretation of bioimpedence results.     Micky Albee, D.O. DABFM, DABOM [facility] [address] [place], [place] [address] [phone]

## 2024-07-12 ENCOUNTER — Ambulatory Visit: Admitting: Family Medicine

## 2024-07-17 ENCOUNTER — Emergency Department (HOSPITAL_COMMUNITY)

## 2024-07-17 ENCOUNTER — Encounter (HOSPITAL_COMMUNITY): Payer: Self-pay | Admitting: Internal Medicine

## 2024-07-17 ENCOUNTER — Inpatient Hospital Stay (HOSPITAL_COMMUNITY)
Admission: EM | Admit: 2024-07-17 | Discharge: 2024-07-18 | DRG: 175 | Disposition: A | Discharge status: Home / Self Care | Attending: Internal Medicine | Admitting: Internal Medicine

## 2024-07-17 ENCOUNTER — Other Ambulatory Visit: Payer: Self-pay

## 2024-07-17 DIAGNOSIS — I878 Other specified disorders of veins: Secondary | ICD-10-CM | POA: Diagnosis present

## 2024-07-17 DIAGNOSIS — Z86718 Personal history of other venous thrombosis and embolism: Secondary | ICD-10-CM

## 2024-07-17 DIAGNOSIS — I2609 Other pulmonary embolism with acute cor pulmonale: Principal | ICD-10-CM | POA: Diagnosis present

## 2024-07-17 DIAGNOSIS — I272 Pulmonary hypertension, unspecified: Secondary | ICD-10-CM | POA: Diagnosis present

## 2024-07-17 DIAGNOSIS — E66813 Obesity, class 3: Secondary | ICD-10-CM | POA: Diagnosis present

## 2024-07-17 DIAGNOSIS — M170 Bilateral primary osteoarthritis of knee: Secondary | ICD-10-CM | POA: Diagnosis present

## 2024-07-17 DIAGNOSIS — E876 Hypokalemia: Secondary | ICD-10-CM | POA: Diagnosis present

## 2024-07-17 DIAGNOSIS — I2699 Other pulmonary embolism without acute cor pulmonale: Secondary | ICD-10-CM | POA: Diagnosis present

## 2024-07-17 DIAGNOSIS — Z8249 Family history of ischemic heart disease and other diseases of the circulatory system: Secondary | ICD-10-CM

## 2024-07-17 DIAGNOSIS — Z7985 Long-term (current) use of injectable non-insulin antidiabetic drugs: Secondary | ICD-10-CM

## 2024-07-17 DIAGNOSIS — Z841 Family history of disorders of kidney and ureter: Secondary | ICD-10-CM

## 2024-07-17 DIAGNOSIS — Z79899 Other long term (current) drug therapy: Secondary | ICD-10-CM | POA: Diagnosis not present

## 2024-07-17 DIAGNOSIS — T45516A Underdosing of anticoagulants, initial encounter: Secondary | ICD-10-CM | POA: Diagnosis present

## 2024-07-17 DIAGNOSIS — I2601 Septic pulmonary embolism with acute cor pulmonale: Secondary | ICD-10-CM | POA: Diagnosis not present

## 2024-07-17 DIAGNOSIS — E039 Hypothyroidism, unspecified: Secondary | ICD-10-CM | POA: Diagnosis present

## 2024-07-17 DIAGNOSIS — Z91128 Patient's intentional underdosing of medication regimen for other reason: Secondary | ICD-10-CM | POA: Diagnosis not present

## 2024-07-17 DIAGNOSIS — E119 Type 2 diabetes mellitus without complications: Secondary | ICD-10-CM | POA: Diagnosis present

## 2024-07-17 DIAGNOSIS — F32A Depression, unspecified: Secondary | ICD-10-CM | POA: Diagnosis present

## 2024-07-17 DIAGNOSIS — G4733 Obstructive sleep apnea (adult) (pediatric): Secondary | ICD-10-CM | POA: Diagnosis present

## 2024-07-17 DIAGNOSIS — Z86711 Personal history of pulmonary embolism: Secondary | ICD-10-CM

## 2024-07-17 DIAGNOSIS — R0602 Shortness of breath: Secondary | ICD-10-CM | POA: Diagnosis present

## 2024-07-17 DIAGNOSIS — I214 Non-ST elevation (NSTEMI) myocardial infarction: Secondary | ICD-10-CM | POA: Diagnosis not present

## 2024-07-17 DIAGNOSIS — Z6841 Body Mass Index (BMI) 40.0 and over, adult: Secondary | ICD-10-CM

## 2024-07-17 DIAGNOSIS — R7989 Other specified abnormal findings of blood chemistry: Secondary | ICD-10-CM | POA: Diagnosis present

## 2024-07-17 DIAGNOSIS — Z823 Family history of stroke: Secondary | ICD-10-CM | POA: Diagnosis not present

## 2024-07-17 DIAGNOSIS — Z833 Family history of diabetes mellitus: Secondary | ICD-10-CM | POA: Diagnosis not present

## 2024-07-17 DIAGNOSIS — Z7901 Long term (current) use of anticoagulants: Secondary | ICD-10-CM | POA: Diagnosis not present

## 2024-07-17 LAB — COMPREHENSIVE METABOLIC PANEL WITH GFR
ALT: 12 U/L (ref 0–44)
AST: 19 U/L (ref 15–41)
Albumin: 3.9 g/dL (ref 3.5–5.0)
Alkaline Phosphatase: 85 U/L (ref 38–126)
Anion gap: 11 (ref 5–15)
BUN: 15 mg/dL (ref 6–20)
CO2: 25 mmol/L (ref 22–32)
Calcium: 9.4 mg/dL (ref 8.9–10.3)
Chloride: 104 mmol/L (ref 98–111)
Creatinine, Ser: 0.83 mg/dL (ref 0.44–1.00)
GFR, Estimated: >60 mL/min (ref >60)
Glucose, Bld: 70 mg/dL (ref 70–99)
Potassium: 3.3 mmol/L — ABNORMAL LOW (ref 3.5–5.1)
Sodium: 140 mmol/L (ref 135–145)
Total Bilirubin: 0.8 mg/dL (ref 0.0–1.2)
Total Protein: 8.2 g/dL — ABNORMAL HIGH (ref 6.5–8.1)

## 2024-07-17 LAB — BLOOD GAS, ARTERIAL
Acid-Base Excess: 1.3 mmol/L (ref 0.0–2.0)
Bicarbonate: 25 mmol/L (ref 20.0–28.0)
Drawn by: 72603
O2 Saturation: 94.9 %
Patient temperature: 36.7
pCO2 arterial: 36 mmHg (ref 32–48)
pH, Arterial: 7.45 (ref 7.35–7.45)
pO2, Arterial: 63 mmHg — ABNORMAL LOW (ref 83–108)

## 2024-07-17 LAB — CBC WITH DIFFERENTIAL/PLATELET
Abs Immature Granulocytes: 0.02 K/uL (ref 0.00–0.07)
Basophils Absolute: 0.1 K/uL (ref 0.0–0.1)
Basophils Relative: 0 %
Eosinophils Absolute: 0.1 K/uL (ref 0.0–0.5)
Eosinophils Relative: 1 %
HCT: 44.1 % (ref 36.0–46.0)
Hemoglobin: 13.6 g/dL (ref 12.0–15.0)
Immature Granulocytes: 0 %
Lymphocytes Relative: 22 %
Lymphs Abs: 2.6 K/uL (ref 0.7–4.0)
MCH: 27.7 pg (ref 26.0–34.0)
MCHC: 30.8 g/dL (ref 30.0–36.0)
MCV: 89.8 fL (ref 80.0–100.0)
Monocytes Absolute: 0.9 K/uL (ref 0.1–1.0)
Monocytes Relative: 8 %
Neutro Abs: 8.2 K/uL — ABNORMAL HIGH (ref 1.7–7.7)
Neutrophils Relative %: 69 %
Platelets: 288 K/uL (ref 150–400)
RBC: 4.91 MIL/uL (ref 3.87–5.11)
RDW: 17.1 % — ABNORMAL HIGH (ref 11.5–15.5)
WBC: 11.9 K/uL — ABNORMAL HIGH (ref 4.0–10.5)
nRBC: 0 % (ref 0.0–0.2)

## 2024-07-17 LAB — TROPONIN I (HIGH SENSITIVITY)
Troponin I (High Sensitivity): 136 ng/L (ref <18)
Troponin I (High Sensitivity): 334 ng/L (ref <18)
Troponin I (High Sensitivity): 465 ng/L (ref <18)

## 2024-07-17 LAB — LIPASE, BLOOD: Lipase: 35 U/L (ref 11–51)

## 2024-07-17 LAB — LIPID PANEL
Cholesterol: 179 mg/dL (ref 0–200)
HDL: 59 mg/dL (ref >40)
LDL Cholesterol: 112 mg/dL — ABNORMAL HIGH (ref 0–99)
Total CHOL/HDL Ratio: 3 ratio
Triglycerides: 40 mg/dL (ref <150)
VLDL: 8 mg/dL (ref 0–40)

## 2024-07-17 LAB — PROTIME-INR
INR: 1.3 — ABNORMAL HIGH (ref 0.8–1.2)
Prothrombin Time: 16.7 s — ABNORMAL HIGH (ref 11.4–15.2)

## 2024-07-17 LAB — BRAIN NATRIURETIC PEPTIDE: B Natriuretic Peptide: 83.5 pg/mL (ref 0.0–100.0)

## 2024-07-17 LAB — TSH: TSH: 2.219 u[IU]/mL (ref 0.350–4.500)

## 2024-07-17 LAB — HCG, SERUM, QUALITATIVE: Preg, Serum: NEGATIVE

## 2024-07-17 LAB — I-STAT CG4 LACTIC ACID, ED
Lactic Acid, Venous: 3 mmol/L (ref 0.5–1.9)
Lactic Acid, Venous: 1.2 mmol/L (ref 0.5–1.9)

## 2024-07-17 LAB — HEPARIN LEVEL (UNFRACTIONATED): Heparin Unfractionated: >1.1 [IU]/mL — ABNORMAL HIGH (ref 0.30–0.70)

## 2024-07-17 LAB — GLUCOSE, CAPILLARY: Glucose-Capillary: 86 mg/dL (ref 70–99)

## 2024-07-17 LAB — APTT: aPTT: 32 s (ref 24–36)

## 2024-07-17 MED ORDER — ACETAMINOPHEN 650 MG RE SUPP
650.0000 mg | Freq: Four times a day (QID) | RECTAL | Status: DC | PRN
Start: 2024-07-17 — End: 2024-07-18

## 2024-07-17 MED ORDER — CALCIUM CARBONATE ANTACID 500 MG PO CHEW
2.0000 | CHEWABLE_TABLET | Freq: Once | ORAL | Status: AC
  Administered 2024-07-17: 400 mg via ORAL
  Filled 2024-07-17: qty 2

## 2024-07-17 MED ORDER — IOHEXOL 350 MG/ML SOLN
80.0000 mL | Freq: Once | INTRAVENOUS | Status: AC | PRN
  Administered 2024-07-17: 80 mL via INTRAVENOUS

## 2024-07-17 MED ORDER — HEPARIN BOLUS VIA INFUSION
4000.0000 [IU] | Freq: Once | INTRAVENOUS | Status: AC
  Administered 2024-07-17: 4000 [IU] via INTRAVENOUS
  Filled 2024-07-17: qty 4000

## 2024-07-17 MED ORDER — ASPIRIN 325 MG PO TABS
325.0000 mg | ORAL_TABLET | Freq: Once | ORAL | Status: AC
  Administered 2024-07-17: 325 mg via ORAL
  Filled 2024-07-17: qty 1

## 2024-07-17 MED ORDER — ONDANSETRON HCL 4 MG/2ML IJ SOLN: 4.0000 mg | Freq: Four times a day (QID) | INTRAMUSCULAR | Status: DC | PRN

## 2024-07-17 MED ORDER — SODIUM CHLORIDE 0.9 % IV BOLUS
500.0000 mL | Freq: Once | INTRAVENOUS | Status: AC
  Administered 2024-07-17: 500 mL via INTRAVENOUS

## 2024-07-17 MED ORDER — ALBUTEROL SULFATE (2.5 MG/3ML) 0.083% IN NEBU: 2.5000 mg | INHALATION_SOLUTION | RESPIRATORY_TRACT | Status: DC | PRN

## 2024-07-17 MED ORDER — ACETAMINOPHEN 325 MG PO TABS
650.0000 mg | ORAL_TABLET | Freq: Four times a day (QID) | ORAL | Status: DC | PRN
Start: 2024-07-17 — End: 2024-07-18

## 2024-07-17 MED ORDER — HEPARIN (PORCINE) 25000 UT/250ML-% IV SOLN
2100.0000 [IU]/h | INTRAVENOUS | Status: DC
  Administered 2024-07-17 – 2024-07-18 (×2): 2100 [IU]/h via INTRAVENOUS
  Filled 2024-07-17 (×2): qty 250

## 2024-07-17 MED ORDER — ONDANSETRON HCL 4 MG PO TABS: 4.0000 mg | ORAL_TABLET | Freq: Four times a day (QID) | ORAL | Status: DC | PRN

## 2024-07-17 NOTE — Progress Notes (Signed)
 PHARMACY - ANTICOAGULATION CONSULT NOTE  Pharmacy Consult for heparin  Indication: pulmonary embolus  No Known Allergies  Patient Measurements:    Vital Signs: Temp: 97.6 F (36.4 C) (07/19 1706) Temp Source: Oral (07/19 1706) BP: 111/79 (07/19 1840) Pulse Rate: 110 (07/19 1840)  Labs: Recent Labs    07/17/24 1753  HGB 13.6  HCT 44.1  PLT 288  CREATININE 0.83  TROPONINIHS 136*    CrCl cannot be calculated (Unknown ideal weight.).   Medical History: Past Medical History:  Diagnosis Date   DVT (deep venous thrombosis) (HCC) 2016   left leg   Edema    Joint pain    Obesity    Osteoarthritis    Pre-diabetes    SOB (shortness of breath)     Medications:  Xarelto  10 mg daily. Last dose 7/19 at 2 pm - but prior to today has not taken for 2 weeks  Assessment: 48 yo F with hx of DVT/PE on Xarelto  prophylactic dose of 10 mg daily.  Per EDP notes she stopped her xarelto  2 weeks ago for dental cleaning and never resumed it but did take a dose today at 2 pm.  Recent travel to [place]. Pharmacy consulted to dose heparin  for acute PE.  Goal of Therapy:  Heparin  level 0.3-0.7 units/ml aPTT 66-102 seconds Monitor platelets by anticoagulation protocol: Yes   Plan:  Get baseline aPTT & heparin  level since pt took a xarelto  dose today Heparin  4000 unit bolus Heparin  drip at 2100 units/hr using [place] Calculator Check 6 hour aPTT & heparin  level Daily CBC, heparin  level & aPTT   Rosaline IVAR Edison, Pharm.D Use secure chat for questions 07/17/2024 7:29 PM

## 2024-07-17 NOTE — ED Triage Notes (Signed)
 Patient states she having shortness of breath. Denies any Asthma or COPD. Does have history of blood clots in lungs and legs. Patient states she was at home and all of a sudden got short of breath when walking, and has gotten worse throughout day. States she is on a blood thinner for clots but stopped two weeks ago and had not started them back. Did take one this am. She also just traveled and was just recently flown. Complains of some chest tightness but no pain.

## 2024-07-17 NOTE — H&P (Signed)
 History and Physical    Catherine Armstrong FMW:[id number] DOB: 1976-05-23 DOA: 07/17/2024  PCP: Teresa Channel, MD  Patient coming from: home  I have personally briefly reviewed patient's old medical records in [facility]  Chief Complaint: chest tightness  HPI: Catherine Armstrong is a 48 y.o. female with medical history significant of  DVT/PE on xarelto , Obesity , DMII, OSA on cpap,sub clinical hypothyroidism,obesity, Depression, OA b/L knee who presents to ED with complaint of chest pain that started acutely after dancing. Of note chest pain was associated with shortness of breath. She notes that she stopped her xarleto 2 weeks ago for a dental procedure and did not restart as of yet. She also mentions during that time she did take a trip to [place]. She notes sob/chest tightness as been persistent since it started at around 3pm day of admission. She notes resting makes it better.  She notes no n/v/d/ diaphoresis, fever/chills or cough. She does endorse intermittent vaginal bleeding, which she states she has had for some time and has been evaluated for this by her GYN in the last few months.  ED Course:  IN ED on evaluation patient was found to have moderated to large burden PE with ? Right heart strain.  Patient CE was also noted to be elevated.  Patient was started on heparin  and slated for admission for further treatment and evaluation.   Vitals: Afeb, bp 156, 102, HR 127, rr 22 sat 95% on ra  EKG: nsr LAFB Cxr:NAD  Labs  Wbc 11.9, plt 288, hgb 13.6,  BNP 83.5  Na 140, K 3.3 , CL 104,  CE 136 Lactic 3.0  CTPE IMPRESSION: Bilateral pulmonary emboli including some central areas along the central right and left pulmonary arterial tree. Moderate clot burden. There is some enlargement of the main pulmonary artery but similar to prior study. Slight flattening of the intraventricular septum. Please correlate for any clinical evidence of right heart strain.   Review of Systems: As  per HPI otherwise 10 point review of systems negative.   Past Medical History:  Diagnosis Date   DVT (deep venous thrombosis) (HCC) 2016   left leg   Edema    Joint pain    Obesity    Osteoarthritis    Pre-diabetes    SOB (shortness of breath)     Past Surgical History:  Procedure Laterality Date   CESAREAN SECTION     TONSILLECTOMY     adenoids removed, tubes put in both ears     reports that she has never smoked. She has never used smokeless tobacco. She reports current alcohol use. She reports that she does not use drugs.  No Known Allergies  Family History  Problem Relation Age of Onset   Obesity Mother    Cancer Father    Hypertension Father    Diabetes Father    Heart disease Father    Kidney disease Father    Obesity Other    Stroke Other    Hypertension Other    Hyperlipidemia Other    Diabetes Other    Prior to Admission medications   Medication Sig Start Date End Date Taking? Authorizing Provider  rivaroxaban  (XARELTO ) 20 MG TABS tablet Take 20 mg by mouth daily with supper.    [provider]  tirzepatide  (MOUNJARO ) 15 MG/0.5ML Pen Inject 15 mg into the skin once a week. 06/14/24   Bowen, Darice BRAVO, DO  topiramate  (TOPAMAX ) 25 MG tablet Take 1 tablet (25 mg  total) by mouth 2 (two) times daily. 06/14/24   Bowen, Darice BRAVO, DO  XARELTO  10 MG TABS tablet Take 10 mg by mouth daily.    [provider]    Physical Exam: Vitals:   07/17/24 1706 07/17/24 1840 07/17/24 2015  BP: (!) 156/102 111/79 (!) 144/91  Pulse: (!) 127 (!) 110 (!) 111  Resp: (!) 22 17 (!) 21  Temp: 97.6 F (36.4 C)    TempSrc: Oral    SpO2: 100% 95% 98%  Weight: (!) 182.8 kg      Constitutional: NAD, calm, comfortable Vitals:   07/17/24 1706 07/17/24 1840 07/17/24 2015  BP: (!) 156/102 111/79 (!) 144/91  Pulse: (!) 127 (!) 110 (!) 111  Resp: (!) 22 17 (!) 21  Temp: 97.6 F (36.4 C)    TempSrc: Oral    SpO2: 100% 95% 98%  Weight: (!) 182.8 kg     Eyes: PERRL,  lids and conjunctivae normal ENMT: Mucous membranes are moist. Posterior pharynx clear of any exudate or lesions.Normal dentition.  Neck: normal, supple, no masses, no thyromegaly Respiratory: clear to auscultation bilaterally, no wheezing, no crackles. Normal respiratory effort. No accessory muscle use.  Cardiovascular: Regular rate and rhythm, no murmurs / rubs / gallops. No extremity edema. 2+ pedal pulses. No carotid bruits.  Abdomen: no tenderness, no masses palpated. No hepatosplenomegaly. Bowel sounds positive.  Musculoskeletal: no clubbing / cyanosis. No joint deformity upper and lower extremities. Good ROM, no contractures. Normal muscle tone.  Skin: no rashes, lesions, ulcers. No induration Neurologic: CN 2-12 grossly intact. Sensation intact, DTR normal. Strength 5/5 in all 4.  Psychiatric: Normal judgment and insight. Alert and oriented x 3. Normal mood.    Labs on Admission: I have personally reviewed following labs and imaging studies  CBC: Recent Labs  Lab 07/17/24 1753  WBC 11.9*  NEUTROABS 8.2*  HGB 13.6  HCT 44.1  MCV 89.8  PLT 288   Basic Metabolic Panel: Recent Labs  Lab 07/17/24 1753  NA 140  K 3.3*  CL 104  CO2 25  GLUCOSE 70  BUN 15  CREATININE 0.83  CALCIUM  9.4   GFR: Estimated Creatinine Clearance: 140.7 mL/min (by C-G formula based on SCr of 0.83 mg/dL). Liver Function Tests: Recent Labs  Lab 07/17/24 1753  AST 19  ALT 12  ALKPHOS 85  BILITOT 0.8  PROT 8.2*  ALBUMIN 3.9   No results for input(s): LIPASE, AMYLASE in the last 168 hours. No results for input(s): AMMONIA in the last 168 hours. Coagulation Profile: Recent Labs  Lab 07/17/24 2006  INR 1.3*   Cardiac Enzymes: No results for input(s): CKTOTAL, CKMB, CKMBINDEX, TROPONINI in the last 168 hours. BNP (last 3 results) No results for input(s): PROBNP in the last 8760 hours. HbA1C: No results for input(s): HGBA1C in the last 72 hours. CBG: No results for  input(s): GLUCAP in the last 168 hours. Lipid Profile: No results for input(s): CHOL, HDL, LDLCALC, TRIG, CHOLHDL, LDLDIRECT in the last 72 hours. Thyroid Function Tests: No results for input(s): TSH, T4TOTAL, FREET4, T3FREE, THYROIDAB in the last 72 hours. Anemia Panel: No results for input(s): VITAMINB12, FOLATE, FERRITIN, TIBC, IRON, RETICCTPCT in the last 72 hours. Urine analysis:    Component Value Date/Time   COLORURINE YELLOW 03/03/2018 2053   APPEARANCEUR HAZY (A) 03/03/2018 2053   LABSPEC 1.015 03/03/2018 2053   PHURINE 6.0 03/03/2018 2053   GLUCOSEU NEGATIVE 03/03/2018 2053   HGBUR LARGE (A) 03/03/2018 2053   BILIRUBINUR NEGATIVE  03/03/2018 2053   KETONESUR 5 (A) 03/03/2018 2053   PROTEINUR NEGATIVE 03/03/2018 2053   NITRITE NEGATIVE 03/03/2018 2053   LEUKOCYTESUR MODERATE (A) 03/03/2018 2053    Radiological Exams on Admission: CT Angio Chest PE W/Cm &/Or Wo Cm Result Date: 07/17/2024 CLINICAL DATA:  Shortness of breath. EXAM: CT ANGIOGRAPHY CHEST WITH CONTRAST TECHNIQUE: Multidetector CT imaging of the chest was performed using the standard protocol during bolus administration of intravenous contrast. Multiplanar CT image reconstructions and MIPs were obtained to evaluate the vascular anatomy. RADIATION DOSE REDUCTION: This exam was performed according to the departmental dose-optimization program which includes automated exposure control, adjustment of the mA and/or kV according to patient size and/or use of iterative reconstruction technique. CONTRAST:  80mL OMNIPAQUE  IOHEXOL  350 MG/ML SOLN COMPARISON:  Chest x-ray earlier 07/17/2024. Prior CTA chest 03/03/2018. FINDINGS: Cardiovascular: Bilateral pulmonary emboli identified. There are some central emboli along the central right and left pulmonary arteries bilaterally. No saddle embolus. Moderate clot burden. Thrombi are seen scattered along lobar and segmental vessels bilaterally greater in  the lower lobes and right upper lobe. Lesser extent along the left upper lobe. Overall moderate clot burden. Heart is slightly enlarged. Slight flattening of the intraventricular septum. No significant reflux of contrast into the intrahepatic IVC. Main pulmonary artery is mildly dilated but similar in diameter to the prior study. Please correlate for clinical evidence of heart strain. No pericardial effusion. The thoracic aorta has a normal course and caliber. Mediastinum/Nodes: Normal caliber thoracic esophagus. Slightly heterogeneous right thyroid lobe. No specific abnormal lymph node enlargement identified in the axillary regions, hilum or mediastinum. Lungs/Pleura: Diffuse breathing motion. No consolidation, pneumothorax or effusion. Upper Abdomen: The adrenal glands are incompletely included in the imaging field. Portions which are seen are grossly preserved. Replaced left hepatic artery from the left gastric, normal variant. Musculoskeletal: Mild degenerative changes along the spine particularly the lower thoracic spine. Stable slight irregularity along the posterior aspect of right third rib, chronic going back 2018 for adjacent subtle pleural thickening. Review of the MIP images confirms the above findings. IMPRESSION: Bilateral pulmonary emboli including some central areas along the central right and left pulmonary arterial tree. Moderate clot burden. There is some enlargement of the main pulmonary artery but similar to prior study. Slight flattening of the intraventricular septum. Please correlate for any clinical evidence of right heart strain. Critical Value/emergent results were called by telephone at the time of interpretation on 07/17/2024 at 7:33 pm to provider ELSIE BODY , who verbally acknowledged these results. Electronically Signed   By: Ranell Bring M.D.   On: 07/17/2024 19:39   DG Chest Port 1 View Result Date: 07/17/2024 CLINICAL DATA:  Shortness of breath. EXAM: PORTABLE CHEST 1 VIEW  COMPARISON:  March 03, 2018 FINDINGS: The heart size and mediastinal contours are within normal limits. Both lungs are clear. The visualized skeletal structures are unremarkable. IMPRESSION: No active disease. Electronically Signed   By: Suzen Dials M.D.   On: 07/17/2024 18:35    EKG: Independently reviewed.   Assessment/Plan  Acute PE with moderate burden and concern for heart strain  - in setting of Prior PE/DVT and being of Xarelto  x 2 weeks  - continue with heparin  drip  - echo in am  - continue with cycle CE  - consult pulmonary in am based on results of Echo   NSTEMI presume  type II  -insetting of PE -continue to cycle CE  - f/u on echo in am  -no current chest  discomfort  Hypokalemia mild -replete   Pre-diabetes  Obesity   -on Mounjaro   - monitor poc    OSA  -on cpap -not difficult time with cpap - currently attempting to find the right mask  Sub clinical hypothyroidism -check tsh    Depression -resume home regimen    OA b/L knee -managed with  steroid injections  DVT prophylaxis: heparin  drip Code Status: full/ as discussed per patient wishes in event of cardiac arrest  Family Communication: none at bedside Disposition Plan: patient  expected to be admitted greater than 2 midnights  Consults called: consider pulmonary based on echo finding in am  Admission status: progressive   Camila DELENA Ned MD [facility]   If 7PM-7AM, please contact night-coverage [email or web address] Password [email or web address]  07/17/2024, 8:42 PM

## 2024-07-17 NOTE — ED Provider Notes (Signed)
 [place] EMERGENCY DEPARTMENT AT [facility] Provider Note  CSN: [id number] Arrival date & time: 07/17/24 1700  Chief Complaint(s) No chief complaint on file.  HPI Catherine Armstrong is a 48 y.o. female history of DVT/PE on Xarelto , currently not taking Xarelto  presenting with shortness of breath.  Patient reports that she developed shortness of breath starting today, associated with some chest tightness.  Worse with exertion.  Reports some chronic leg swelling.  Did recently travel to [place].  No cough.  No fevers or chills.  No sore throat or runny nose.  No back pain.  No belly pain.  Did stop her Xarelto  2 weeks ago for dental cleaning and never resumed it.   Past Medical History Past Medical History:  Diagnosis Date   DVT (deep venous thrombosis) (HCC) 2016   left leg   Edema    Joint pain    Obesity    Osteoarthritis    Pre-diabetes    SOB (shortness of breath)    Patient Active Problem List   Diagnosis Date Noted   PE (pulmonary thromboembolism) (HCC) 07/17/2024   OSA on CPAP 05/10/2024   VTE (venous thromboembolism) 03/04/2024   Primary osteoarthritis of both knees 03/04/2024   Prediabetes 03/04/2024   Sleep-disordered breathing 03/04/2024   Pulmonary embolism (HCC) 03/03/2018   Morbid obesity (HCC) 03/03/2018   DVT (deep venous thrombosis) (HCC) 03/03/2018   Home Medication(s) Prior to Admission medications   Medication Sig Start Date End Date Taking? Authorizing Provider  rivaroxaban  (XARELTO ) 20 MG TABS tablet Take 20 mg by mouth daily with supper.    [provider]  tirzepatide  (MOUNJARO ) 15 MG/0.5ML Pen Inject 15 mg into the skin once a week. 06/14/24   Bowen, Darice BRAVO, DO  topiramate  (TOPAMAX ) 25 MG tablet Take 1 tablet (25 mg total) by mouth 2 (two) times daily. 06/14/24   Bowen, Darice BRAVO, DO  XARELTO  10 MG TABS tablet Take 10 mg by mouth daily.    [provider]                                                                                                                                     Past Surgical History Past Surgical History:  Procedure Laterality Date   CESAREAN SECTION     TONSILLECTOMY     adenoids removed, tubes put in both ears   Family History Family History  Problem Relation Age of Onset   Obesity Mother    Cancer Father    Hypertension Father    Diabetes Father    Heart disease Father    Kidney disease Father    Obesity Other    Stroke Other    Hypertension Other    Hyperlipidemia Other    Diabetes Other     Social History Social History   Tobacco Use   Smoking status: Never   Smokeless tobacco: Never  Vaping Use  Vaping status: Never Used  Substance Use Topics   Alcohol use: Yes    Comment: occasional   Drug use: No   Allergies Patient has no known allergies.  Review of Systems Review of Systems  All other systems reviewed and are negative.   Physical Exam Vital Signs  I have reviewed the triage vital signs BP (!) 144/91   Pulse (!) 111   Temp 98.1 F (36.7 C) (Oral)   Resp (!) 21   Wt (!) 182.8 kg Comment: per ED RN 07/17/2024  SpO2 98%   BMI 65.05 kg/m  Physical Exam Vitals and nursing note reviewed.  Constitutional:      General: She is not in acute distress.    Appearance: She is well-developed. She is obese.  HENT:     Head: Normocephalic and atraumatic.     Mouth/Throat:     Mouth: Mucous membranes are moist.  Eyes:     Pupils: Pupils are equal, round, and reactive to light.  Cardiovascular:     Rate and Rhythm: Regular rhythm. Tachycardia present.     Heart sounds: No murmur heard. Pulmonary:     Effort: No respiratory distress.     Breath sounds: Normal breath sounds.     Comments: Mild tachypnea  Abdominal:     General: Abdomen is flat.     Palpations: Abdomen is soft.     Tenderness: There is no abdominal tenderness.  Musculoskeletal:        General: No tenderness.     Right lower leg: Edema present.     Left lower leg: Edema present.   Skin:    General: Skin is warm and dry.  Neurological:     General: No focal deficit present.     Mental Status: She is alert. Mental status is at baseline.  Psychiatric:        Mood and Affect: Mood normal.        Behavior: Behavior normal.     ED Results and Treatments Labs (all labs ordered are listed, but only abnormal results are displayed) Labs Reviewed  COMPREHENSIVE METABOLIC PANEL WITH GFR - Abnormal; Notable for the following components:      Result Value   Potassium 3.3 (*)    Total Protein 8.2 (*)    All other components within normal limits  CBC WITH DIFFERENTIAL/PLATELET - Abnormal; Notable for the following components:   WBC 11.9 (*)    RDW 17.1 (*)    Neutro Abs 8.2 (*)    All other components within normal limits  HEPARIN  LEVEL (UNFRACTIONATED) - Abnormal; Notable for the following components:   Heparin  Unfractionated >1.10 (*)    All other components within normal limits  PROTIME-INR - Abnormal; Notable for the following components:   Prothrombin Time 16.7 (*)    INR 1.3 (*)    All other components within normal limits  I-STAT CG4 LACTIC ACID, ED - Abnormal; Notable for the following components:   Lactic Acid, Venous 3.0 (*)    All other components within normal limits  TROPONIN I (HIGH SENSITIVITY) - Abnormal; Notable for the following components:   Troponin I (High Sensitivity) 136 (*)    All other components within normal limits  TROPONIN I (HIGH SENSITIVITY) - Abnormal; Notable for the following components:   Troponin I (High Sensitivity) 334 (*)    All other components within normal limits  BRAIN NATRIURETIC PEPTIDE  HCG, SERUM, QUALITATIVE  APTT  CBC  APTT  HEPARIN  LEVEL (UNFRACTIONATED)  I-STAT CHEM 8, ED  I-STAT CG4 LACTIC ACID, ED                                                                                                                          Radiology CT Angio Chest PE W/Cm &/Or Wo Cm Result Date: 07/17/2024 CLINICAL DATA:   Shortness of breath. EXAM: CT ANGIOGRAPHY CHEST WITH CONTRAST TECHNIQUE: Multidetector CT imaging of the chest was performed using the standard protocol during bolus administration of intravenous contrast. Multiplanar CT image reconstructions and MIPs were obtained to evaluate the vascular anatomy. RADIATION DOSE REDUCTION: This exam was performed according to the departmental dose-optimization program which includes automated exposure control, adjustment of the mA and/or kV according to patient size and/or use of iterative reconstruction technique. CONTRAST:  80mL OMNIPAQUE  IOHEXOL  350 MG/ML SOLN COMPARISON:  Chest x-ray earlier 07/17/2024. Prior CTA chest 03/03/2018. FINDINGS: Cardiovascular: Bilateral pulmonary emboli identified. There are some central emboli along the central right and left pulmonary arteries bilaterally. No saddle embolus. Moderate clot burden. Thrombi are seen scattered along lobar and segmental vessels bilaterally greater in the lower lobes and right upper lobe. Lesser extent along the left upper lobe. Overall moderate clot burden. Heart is slightly enlarged. Slight flattening of the intraventricular septum. No significant reflux of contrast into the intrahepatic IVC. Main pulmonary artery is mildly dilated but similar in diameter to the prior study. Please correlate for clinical evidence of heart strain. No pericardial effusion. The thoracic aorta has a normal course and caliber. Mediastinum/Nodes: Normal caliber thoracic esophagus. Slightly heterogeneous right thyroid lobe. No specific abnormal lymph node enlargement identified in the axillary regions, hilum or mediastinum. Lungs/Pleura: Diffuse breathing motion. No consolidation, pneumothorax or effusion. Upper Abdomen: The adrenal glands are incompletely included in the imaging field. Portions which are seen are grossly preserved. Replaced left hepatic artery from the left gastric, normal variant. Musculoskeletal: Mild degenerative  changes along the spine particularly the lower thoracic spine. Stable slight irregularity along the posterior aspect of right third rib, chronic going back 2018 for adjacent subtle pleural thickening. Review of the MIP images confirms the above findings. IMPRESSION: Bilateral pulmonary emboli including some central areas along the central right and left pulmonary arterial tree. Moderate clot burden. There is some enlargement of the main pulmonary artery but similar to prior study. Slight flattening of the intraventricular septum. Please correlate for any clinical evidence of right heart strain. Critical Value/emergent results were called by telephone at the time of interpretation on 07/17/2024 at 7:33 pm to provider ELSIE BODY , who verbally acknowledged these results. Electronically Signed   By: Ranell Bring M.D.   On: 07/17/2024 19:39   DG Chest Port 1 View Result Date: 07/17/2024 CLINICAL DATA:  Shortness of breath. EXAM: PORTABLE CHEST 1 VIEW COMPARISON:  March 03, 2018 FINDINGS: The heart size and mediastinal contours are within normal limits. Both lungs are clear. The visualized skeletal structures are unremarkable. IMPRESSION: No active disease. Electronically Signed   By:  Suzen Dials M.D.   On: 07/17/2024 18:35    Pertinent labs & imaging results that were available during my care of the patient were reviewed by me and considered in my medical decision making (see MDM for details).  Medications Ordered in ED Medications  heparin  ADULT infusion 100 units/mL (25000 units/250mL) (2,100 Units/hr Intravenous New Bag/Given 07/17/24 2013)  sodium chloride  0.9 % bolus 500 mL (0 mLs Intravenous Stopped 07/17/24 1906)  iohexol  (OMNIPAQUE ) 350 MG/ML injection 80 mL (80 mLs Intravenous Contrast Given 07/17/24 1900)  heparin  bolus via infusion 4,000 Units (4,000 Units Intravenous Bolus from Bag 07/17/24 2014)                                                                                                                                      Procedures .Critical Care  Performed by: Francesca Elsie CROME, MD Authorized by: Francesca Elsie CROME, MD   Critical care provider statement:    Critical care time (minutes):  40   Critical care was necessary to treat or prevent imminent or life-threatening deterioration of the following conditions:  Cardiac failure and respiratory failure   Critical care was time spent personally by me on the following activities:  Development of treatment plan with patient or surrogate, discussions with consultants, evaluation of patient's response to treatment, examination of patient, ordering and review of laboratory studies, ordering and review of radiographic studies, ordering and performing treatments and interventions, pulse oximetry, re-evaluation of patient's condition and review of old charts   Care discussed with: admitting provider     (including critical care time)  Medical Decision Making / ED Course   MDM:  48 year old presenting to the emergency room with shortness of breath.  Patient overall well-appearing, but does have tachycardia and mild tachypnea.  Highest concern is for possible pulmonary embolism given discontinuation of Xarelto  and sudden onset of symptoms.  Will obtain CTA chest.  Differential also includes ACS, EKG with no acute ischemic changes, will check troponin x 2.  Differentials include CHF although unusual to have acute onset, will check BNP.  Lower concern for other process such as aortic dissection, pneumonia, pneumothorax.  No wheezing to suggest COPD or bronchospasm.  Will reassess.  Clinical Course as of 07/17/24 2108  Sat Jul 17, 2024  2105 Patient admitted to medicine. Does have multiple PE. BNP normal but troponin elevated, likely due to PE. BP has been stable.  [WS]    Clinical Course User Index [WS] Francesca Elsie CROME, MD     Additional history obtained: -Additional history obtained from friend -External records  from outside source obtained and reviewed including: Chart review including previous notes, labs, imaging, consultation notes including prior notes    Lab Tests: -I ordered, reviewed, and interpreted labs.   The pertinent results include:   Labs Reviewed  COMPREHENSIVE METABOLIC PANEL WITH GFR - Abnormal; Notable for the following components:  Result Value   Potassium 3.3 (*)    Total Protein 8.2 (*)    All other components within normal limits  CBC WITH DIFFERENTIAL/PLATELET - Abnormal; Notable for the following components:   WBC 11.9 (*)    RDW 17.1 (*)    Neutro Abs 8.2 (*)    All other components within normal limits  HEPARIN  LEVEL (UNFRACTIONATED) - Abnormal; Notable for the following components:   Heparin  Unfractionated >1.10 (*)    All other components within normal limits  PROTIME-INR - Abnormal; Notable for the following components:   Prothrombin Time 16.7 (*)    INR 1.3 (*)    All other components within normal limits  I-STAT CG4 LACTIC ACID, ED - Abnormal; Notable for the following components:   Lactic Acid, Venous 3.0 (*)    All other components within normal limits  TROPONIN I (HIGH SENSITIVITY) - Abnormal; Notable for the following components:   Troponin I (High Sensitivity) 136 (*)    All other components within normal limits  TROPONIN I (HIGH SENSITIVITY) - Abnormal; Notable for the following components:   Troponin I (High Sensitivity) 334 (*)    All other components within normal limits  BRAIN NATRIURETIC PEPTIDE  HCG, SERUM, QUALITATIVE  APTT  CBC  APTT  HEPARIN  LEVEL (UNFRACTIONATED)  I-STAT CHEM 8, ED  I-STAT CG4 LACTIC ACID, ED    Notable for elevated troponin  EKG   EKG Interpretation Date/Time:  Saturday July 17 2024 17:07:01 EDT Ventricular Rate:  121 PR Interval:  131 QRS Duration:  98 QT Interval:  336 QTC Calculation: 477 R Axis:   -88  Text Interpretation: Sinus tachycardia Left anterior fascicular block Consider anterior infarct  Confirmed by Francesca Fallow ([id number]) on 07/17/2024 5:26:09 PM         Imaging Studies ordered: I ordered imaging studies including CTA chest On my interpretation imaging demonstrates pulmonary emboli I independently visualized and interpreted imaging. I agree with the radiologist interpretation   Medicines ordered and prescription drug management: Meds ordered this encounter  Medications   sodium chloride  0.9 % bolus 500 mL   iohexol  (OMNIPAQUE ) 350 MG/ML injection 80 mL   heparin  ADULT infusion 100 units/mL (25000 units/250mL)   heparin  bolus via infusion 4,000 Units    -I have reviewed the patients home medicines and have made adjustments as needed   Consultations Obtained: I requested consultation with the hospitalist,  and discussed lab and imaging findings as well as pertinent plan - they recommend: admission   Cardiac Monitoring: The patient was maintained on a cardiac monitor.  I personally viewed and interpreted the cardiac monitored which showed an underlying rhythm of: NSR  Social Determinants of Health:  Diagnosis or treatment significantly limited by social determinants of health: obesity   Reevaluation: After the interventions noted above, I reevaluated the patient and found that their symptoms have improved  Co morbidities that complicate the patient evaluation  Past Medical History:  Diagnosis Date   DVT (deep venous thrombosis) (HCC) 2016   left leg   Edema    Joint pain    Obesity    Osteoarthritis    Pre-diabetes    SOB (shortness of breath)       Dispostion: Disposition decision including need for hospitalization was considered, and patient admitted to the hospital.    Final Clinical Impression(s) / ED Diagnoses Final diagnoses:  Acute pulmonary embolism with acute cor pulmonale, unspecified pulmonary embolism type (HCC)     This chart was dictated using  voice recognition software.  Despite best efforts to proofread,  errors can  occur which can change the documentation meaning.    Francesca Elsie CROME, MD 07/17/24 2108

## 2024-07-18 ENCOUNTER — Inpatient Hospital Stay (HOSPITAL_COMMUNITY)

## 2024-07-18 DIAGNOSIS — G4733 Obstructive sleep apnea (adult) (pediatric): Secondary | ICD-10-CM | POA: Diagnosis not present

## 2024-07-18 DIAGNOSIS — I272 Pulmonary hypertension, unspecified: Secondary | ICD-10-CM | POA: Diagnosis not present

## 2024-07-18 DIAGNOSIS — I2601 Septic pulmonary embolism with acute cor pulmonale: Secondary | ICD-10-CM | POA: Diagnosis not present

## 2024-07-18 DIAGNOSIS — I2699 Other pulmonary embolism without acute cor pulmonale: Secondary | ICD-10-CM | POA: Diagnosis not present

## 2024-07-18 DIAGNOSIS — I2609 Other pulmonary embolism with acute cor pulmonale: Secondary | ICD-10-CM | POA: Diagnosis not present

## 2024-07-18 LAB — CBC
HCT: 39.2 % (ref 36.0–46.0)
Hemoglobin: 12.3 g/dL (ref 12.0–15.0)
MCH: 28.1 pg (ref 26.0–34.0)
MCHC: 31.4 g/dL (ref 30.0–36.0)
MCV: 89.5 fL (ref 80.0–100.0)
Platelets: 233 K/uL (ref 150–400)
RBC: 4.38 MIL/uL (ref 3.87–5.11)
RDW: 17.1 % — ABNORMAL HIGH (ref 11.5–15.5)
WBC: 12 K/uL — ABNORMAL HIGH (ref 4.0–10.5)
nRBC: 0 % (ref 0.0–0.2)

## 2024-07-18 LAB — COMPREHENSIVE METABOLIC PANEL WITH GFR
ALT: 11 U/L (ref 0–44)
AST: 16 U/L (ref 15–41)
Albumin: 3.3 g/dL — ABNORMAL LOW (ref 3.5–5.0)
Alkaline Phosphatase: 73 U/L (ref 38–126)
Anion gap: 10 (ref 5–15)
BUN: 16 mg/dL (ref 6–20)
CO2: 24 mmol/L (ref 22–32)
Calcium: 9.4 mg/dL (ref 8.9–10.3)
Chloride: 107 mmol/L (ref 98–111)
Creatinine, Ser: 0.74 mg/dL (ref 0.44–1.00)
GFR, Estimated: >60 mL/min (ref >60)
Glucose, Bld: 103 mg/dL — ABNORMAL HIGH (ref 70–99)
Potassium: 4.1 mmol/L (ref 3.5–5.1)
Sodium: 141 mmol/L (ref 135–145)
Total Bilirubin: 0.8 mg/dL (ref 0.0–1.2)
Total Protein: 7 g/dL (ref 6.5–8.1)

## 2024-07-18 LAB — APTT
aPTT: 73 s — ABNORMAL HIGH (ref 24–36)
aPTT: 78 s — ABNORMAL HIGH (ref 24–36)
aPTT: 79 s — ABNORMAL HIGH (ref 24–36)

## 2024-07-18 LAB — ECHOCARDIOGRAM COMPLETE
Area-P 1/2: 4.54 cm2
Height: 66 in
S' Lateral: 3 cm
Weight: 6511.51 [oz_av]

## 2024-07-18 LAB — GLUCOSE, CAPILLARY
Glucose-Capillary: 91 mg/dL (ref 70–99)
Glucose-Capillary: 81 mg/dL (ref 70–99)
Glucose-Capillary: 83 mg/dL (ref 70–99)

## 2024-07-18 LAB — HEMOGLOBIN A1C
Hgb A1c MFr Bld: 5.3 % (ref 4.8–5.6)
Mean Plasma Glucose: 105.41 mg/dL

## 2024-07-18 LAB — HIV ANTIBODY (ROUTINE TESTING W REFLEX): HIV Screen 4th Generation wRfx: NONREACTIVE

## 2024-07-18 LAB — HEPARIN LEVEL (UNFRACTIONATED): Heparin Unfractionated: 0.99 [IU]/mL — ABNORMAL HIGH (ref 0.30–0.70)

## 2024-07-18 LAB — TROPONIN I (HIGH SENSITIVITY)
Troponin I (High Sensitivity): 361 ng/L (ref <18)
Troponin I (High Sensitivity): 315 ng/L (ref <18)

## 2024-07-18 MED ORDER — CALCIUM CARBONATE ANTACID 500 MG PO CHEW
1.0000 | CHEWABLE_TABLET | Freq: Three times a day (TID) | ORAL | Status: DC | PRN
  Administered 2024-07-18: 200 mg via ORAL
  Filled 2024-07-18: qty 1

## 2024-07-18 MED ORDER — HEPARIN (PORCINE) 25000 UT/250ML-% IV SOLN: 2100.0000 [IU]/h | INTRAVENOUS | Status: DC

## 2024-07-18 MED ORDER — RIVAROXABAN (XARELTO) VTE STARTER PACK (15 & 20 MG)
ORAL_TABLET | ORAL | 0 refills | Status: AC
  Filled 2024-07-18: qty 51, 30d supply, fill #0

## 2024-07-18 MED ORDER — RIVAROXABAN 15 MG PO TABS
15.0000 mg | ORAL_TABLET | Freq: Once | ORAL | Status: AC
  Administered 2024-07-18: 15 mg via ORAL
  Filled 2024-07-18: qty 1

## 2024-07-18 MED ORDER — RIVAROXABAN (XARELTO) VTE STARTER PACK (15 & 20 MG): ORAL_TABLET | ORAL | 0 refills | Status: DC

## 2024-07-18 MED ORDER — HEPARIN BOLUS VIA INFUSION: 4000.0000 [IU] | Freq: Once | INTRAVENOUS | Status: DC

## 2024-07-18 MED ORDER — T.E.D. BELOW KNEE/MEDIUM MISC
1.0000 | Freq: Every day | 0 refills | Status: AC
Start: 2024-07-18 — End: ?

## 2024-07-18 MED ORDER — RIVAROXABAN 15 MG PO TABS: 15.0000 mg | ORAL_TABLET | Freq: Two times a day (BID) | ORAL | Status: DC

## 2024-07-18 NOTE — Plan of Care (Signed)
  Problem: Education: Goal: Knowledge of General Education information will improve Description: Including pain rating scale, medication(s)/side effects and non-pharmacologic comfort measures Outcome: Progressing   Problem: Clinical Measurements: Goal: Diagnostic test results will improve Outcome: Progressing Goal: Respiratory complications will improve Outcome: Progressing Goal: Cardiovascular complication will be avoided Outcome: Progressing   Problem: Activity: Goal: Risk for activity intolerance will decrease Outcome: Progressing   Problem: Nutrition: Goal: Adequate nutrition will be maintained Outcome: Progressing   Problem: Coping: Goal: Level of anxiety will decrease Outcome: Progressing   Problem: Pain Managment: Goal: General experience of comfort will improve and/or be controlled Outcome: Progressing

## 2024-07-18 NOTE — Progress Notes (Signed)
 PHARMACY - ANTICOAGULATION CONSULT NOTE  Pharmacy Consult for heparin  Indication: pulmonary embolus  No Known Allergies  Patient Measurements: Height: 5' 6 (167.6 cm) Weight: (!) 184.6 kg (406 lb 15.5 oz) IBW/kg (Calculated) : 59.3 HEPARIN  DW (KG): 107.3  Vital Signs: Temp: 98.4 F (36.9 C) (07/20 0500) Temp Source: Oral (07/20 0500) BP: 121/78 (07/20 0500) Pulse Rate: 98 (07/20 0500)  Labs: Recent Labs    07/17/24 1753 07/17/24 1753 07/17/24 2006 07/17/24 2138 07/18/24 0234 07/18/24 0530 07/18/24 1111  HGB 13.6  --   --   --  12.3  --   --   HCT 44.1  --   --   --  39.2  --   --   PLT 288  --   --   --  233  --   --   APTT  --    < > 32  --  73* 78* 79*  LABPROT  --   --  16.7*  --   --   --   --   INR  --   --  1.3*  --   --   --   --   HEPARINUNFRC  --   --  >1.10*  --  0.99*  --   --   CREATININE 0.83  --   --   --  0.74  --   --   TROPONINIHS 136*  --  334* 465* 361* 315*  --    < > = values in this interval not displayed.    Estimated Creatinine Clearance: 146.9 mL/min (by C-G formula based on SCr of 0.74 mg/dL).   Medical History: Past Medical History:  Diagnosis Date   DVT (deep venous thrombosis) (HCC) 2016   left leg   Edema    Joint pain    Obesity    Osteoarthritis    Pre-diabetes    SOB (shortness of breath)     Medications:  Xarelto  10 mg daily. Last dose 7/19 at 2 pm - but prior to today has not taken for 2 weeks  Assessment: 48 yo F with hx of DVT/PE on Xarelto  prophylactic dose of 10 mg daily.  Per EDP notes she stopped her xarelto  2 weeks ago for dental cleaning and never resumed it but did take a dose today at 2 pm.  Recent travel to [place]. Pharmacy consulted to dose heparin  for acute PE.  Baseline heparin  level >1.1, Hg & plt WNL  07/18/2024: 11:11 aPTT 79 sec- therapeutic on IV heparin  2100 units/hr HL 0.99- remains elevated due to DOAC use.  Will continue to use aPTT to monitor until HL correlates with aPTT CBC: Hg, plt  WNL No bleeding or infusion related concerns reported by RN  Goal of Therapy:  Heparin  level 0.3-0.7 units/ml aPTT 66-102 seconds Monitor platelets by anticoagulation protocol: Yes   Plan:  Continue heparin  drip at 2100 units/hr  Daily CBC, heparin  level & aPTT until correlating then may use only heparin  level for titration, and monitor for signs/symptoms of bleeding  Thank you for allowing pharmacy to be a part of this patient's care.  Eleanor EMERSON Agent, PharmD, BCPS Clinical Pharmacist [place] 07/18/2024 1:34 PM

## 2024-07-18 NOTE — Progress Notes (Signed)
 Patient ambulated around 100 feet in hallway with standby assist. O2 Sats remained 91-93% RA. HR 140s, did reach as high as 152. Pt stated she did feel more winded than usual. Tolerated walk but was SOB upon returning to her room. Able to recover on RA. Denied CP. Did c/o gas pains in her back- Tums requested. MD Rizwan made aware of updates.

## 2024-07-18 NOTE — TOC Initial Note (Signed)
 Transition of Care [facility]) - Initial/Assessment Note    Patient Details  Name: Catherine Armstrong MRN: [id number] Date of Birth: [birth year]  [facility] CM/SW Contact:    Sonda Manuella Quill, RN Phone Number: 07/18/2024, 4:53 PM  Clinical Narrative:                 [place] w/ pt in room; pt says she lives at home; her son and nephew live w/ her; she plans to return home at d/c; pt identified POC sister Emerick Mace [phone]); she will provide transportation; pt verified insurance/PCP; she denied SDOH risks; pt says she has cpap; she does not have other DME, HH services, or home oxygen; no TOC needs identified; TOC is signing off.  Expected Discharge Plan: Home/Self Care Barriers to Discharge: No Barriers Identified   Patient Goals and CMS Choice Patient states their goals for this hospitalization and ongoing recovery are:: home          Expected Discharge Plan and Services   Discharge Planning Services: CM Consult   Living arrangements for the past 2 months: Single Family Home Expected Discharge Date: 07/18/24               DME Arranged: N/A DME Agency: NA       HH Arranged: NA HH Agency: NA        Prior Living Arrangements/Services Living arrangements for the past 2 months: Single Family Home Lives with:: Relatives Patient language and need for interpreter reviewed:: Yes Do you feel safe going back to the place where you live?: Yes      Need for Family Participation in Patient Care: Yes (Comment) Care giver support system in place?: Yes (comment) Current home services: DME (cpap) Criminal Activity/Legal Involvement Pertinent to Current Situation/Hospitalization: No - Comment as needed  Activities of Daily Living   ADL Screening (condition at time of admission) Independently performs ADLs?: Yes (appropriate for developmental age) Is the patient deaf or have difficulty hearing?: No Does the patient have difficulty seeing, even when wearing  glasses/contacts?: No Does the patient have difficulty concentrating, remembering, or making decisions?: No  Permission Sought/Granted Permission sought to share information with : Case Manager Permission granted to share information with : Yes, Verbal Permission Granted  Share Information with NAME: Case Manager     Permission granted to share info w Relationship: Emerick Mace (sister) [phone]     Emotional Assessment Appearance:: Appears stated age Attitude/Demeanor/Rapport: Gracious Affect (typically observed): Accepting Orientation: : Oriented to Self, Oriented to Place, Oriented to  Time, Oriented to Situation Alcohol / Substance Use: Not Applicable Psych Involvement: No (comment)  Admission diagnosis:  PE (pulmonary thromboembolism) (HCC) [I26.99] Acute pulmonary embolism with acute cor pulmonale, unspecified pulmonary embolism type (HCC) [I26.09] Patient Active Problem List   Diagnosis Date Noted   PE (pulmonary thromboembolism) (HCC) 07/17/2024   OSA on CPAP 05/10/2024   VTE (venous thromboembolism) 03/04/2024   Primary osteoarthritis of both knees 03/04/2024   Prediabetes 03/04/2024   Sleep-disordered breathing 03/04/2024   Pulmonary embolism (HCC) 03/03/2018   Morbid obesity (HCC) 03/03/2018   DVT (deep venous thrombosis) (HCC) 03/03/2018   PCP:  Teresa Channel, MD Pharmacy:   [phone] [place], [place] - [address] [place] [address] Phone: [phone] Fax: [phone]  [place] [facility] [address] [place] [address] Phone: [phone] Fax: [phone]     Social Drivers of  Health (SDOH) Social History: SDOH Screenings   Food Insecurity: No Food Insecurity (07/18/2024)  Housing: Low Risk  (07/18/2024)  Transportation Needs: No Transportation Needs (07/18/2024)  Utilities: Not At Risk (07/18/2024)  Tobacco Use: Low Risk  (07/17/2024)   SDOH  Interventions: Food Insecurity Interventions: Intervention Not Indicated, Inpatient TOC Housing Interventions: Intervention Not Indicated, Inpatient TOC Transportation Interventions: Intervention Not Indicated, Inpatient TOC Utilities Interventions: Intervention Not Indicated, Inpatient TOC   Readmission Risk Interventions     No data to display

## 2024-07-18 NOTE — Discharge Instructions (Signed)
 Information on my medicine - XARELTO  (rivaroxaban )  This medication education was reviewed with me or my healthcare representative as part of my discharge preparation.  WHY WAS XARELTO  PRESCRIBED FOR YOU? Xarelto  was prescribed to treat blood clots that may have been found in the veins of your legs (deep vein thrombosis) or in your lungs (pulmonary embolism) and to reduce the risk of them occurring again.  What do you need to know about Xarelto ? The starting dose is one 15 mg tablet taken TWICE daily with food for the FIRST 21 DAYS then on 08/08/2024  the dose is changed to one 20 mg tablet taken ONCE A DAY with your evening meal.  DO NOT stop taking Xarelto  without talking to the health care provider who prescribed the medication.  Refill your prescription for 20 mg tablets before you run out.  After discharge, you should have regular check-up appointments with your healthcare provider that is prescribing your Xarelto .  In the future your dose may need to be changed if your kidney function changes by a significant amount.  What do you do if you miss a dose? If you are taking Xarelto  TWICE DAILY and you miss a dose, take it as soon as you remember. You may take two 15 mg tablets (total 30 mg) at the same time then resume your regularly scheduled 15 mg twice daily the next day.  If you are taking Xarelto  ONCE DAILY and you miss a dose, take it as soon as you remember on the same day then continue your regularly scheduled once daily regimen the next day. Do not take two doses of Xarelto  at the same time.   Important Safety Information Xarelto  is a blood thinner medicine that can cause bleeding. You should call your healthcare provider right away if you experience any of the following: Bleeding from an injury or your nose that does not stop. Unusual colored urine (red or dark brown) or unusual colored stools (red or black). Unusual bruising for unknown reasons. A serious fall or if you  hit your head (even if there is no bleeding).  Some medicines may interact with Xarelto  and might increase your risk of bleeding while on Xarelto . To help avoid this, consult your healthcare provider or pharmacist prior to using any new prescription or non-prescription medications, including herbals, vitamins, non-steroidal anti-inflammatory drugs (NSAIDs) and supplements.  This website has more information on Xarelto : www.xarelto .com.

## 2024-07-18 NOTE — Consult Note (Signed)
   NAME:  Catherine Armstrong, MRN:  [id number], DOB:  [birth year], LOS: 1 ADMISSION DATE:  07/17/2024, CONSULTATION DATE: 07/18/2024 REFERRING MD: Dr. Earley, CHIEF COMPLAINT: Pulmonary embolism  History of Present Illness:  Asked to see patient with pulmonary embolism with right heart strain  Third episode of VTE Recently had a dental procedure and held Xarelto  for the procedure, did not go back on it immediately. Developed shortness of breath chest tightness on the day of admission. Denies any other associated symptoms She is feeling better at present  On room air, not tachycardic  Workup for thrombophilia was negative previously  Recent diagnosis of obstructive sleep apnea, working on getting used to using the machine, some mask issues  Pertinent  Medical History   Past Medical History:  Diagnosis Date   DVT (deep venous thrombosis) (HCC) 2016   left leg   Edema    Joint pain    Obesity    Osteoarthritis    Pre-diabetes    SOB (shortness of breath)    Significant Hospital Events: Including procedures, antibiotic start and stop dates in addition to other pertinent events   7/20-CT PE noted for bilateral clots  Interim History / Subjective:  Middle-aged, does not appear to be in distress Appears comfortable at rest Denies pain or discomfort  Objective    Blood pressure 121/78, pulse 98, temperature 98.4 F (36.9 C), temperature source Oral, resp. rate 18, height 5' 6 (1.676 m), weight (!) 184.6 kg, SpO2 100%.        Intake/Output Summary (Last 24 hours) at 07/18/2024 1355 Last data filed at 07/18/2024 0600 Gross per 24 hour  Intake 242.45 ml  Output --  Net 242.45 ml   Filed Weights   07/17/24 1706 07/17/24 2318  Weight: (!) 182.8 kg (!) 184.6 kg    Examination: General: Middle-aged, does not appear to be in distress HENT: Moist oral mucosa Lungs: Clear breath sounds bilaterally Cardiovascular: S1-S2 appreciated Abdomen: Soft, bowel sounds  appreciated Extremities: No clubbing, Neuro: Awake alert oriented x 3 nonfocal GU:   I reviewed last 24 h vitals and pain scores, last 48 h intake and output, last 24 h labs and trends, and last 24 h imaging results. Echocardiogram with some right-sided dysfunction and mild pulmonary hypertension Resolved problem list   Assessment and Plan   Pulmonary embolism with right heart strain Simplified PESI places her at low risk - Developed clot probably secondary to being off anticoagulation  Recent diagnosis of obstructive sleep apnea - Needs to use CPAP - Still working on finding a right mask  Mild pulmonary hypertension may be secondary to the clot or chronically from sleep disordered breathing  Hemodynamics stable at the present time Clinically does look very stable as well I do not believe we need to escalate treatment  Will suggest resuming Xarelto   Continue weight loss efforts  Compression stockings can be considered for leg edema if no acute clots in lower extremities  Graded activities as tolerated  Lifelong anticoagulation Previous thrombophilia screening negative

## 2024-07-18 NOTE — Progress Notes (Signed)
  Echocardiogram 2D Echocardiogram has been performed.  Koleen KANDICE Popper, RDCS 07/18/2024, 8:32 AM

## 2024-07-18 NOTE — Progress Notes (Signed)
 PHARMACY - ANTICOAGULATION CONSULT NOTE  Pharmacy Consult for heparin  Indication: pulmonary embolus  No Known Allergies  Patient Measurements: Height: 5' 6 (167.6 cm) Weight: (!) 184.6 kg (406 lb 15.5 oz) IBW/kg (Calculated) : 59.3 HEPARIN  DW (KG): 107.3  Vital Signs: Temp: 98 F (36.7 C) (07/19 2318) Temp Source: Oral (07/19 2318) BP: 137/94 (07/19 2318) Pulse Rate: 106 (07/19 2318)  Labs: Recent Labs    07/17/24 1753 07/17/24 2006 07/17/24 2138 07/18/24 0234  HGB 13.6  --   --  12.3  HCT 44.1  --   --  39.2  PLT 288  --   --  233  APTT  --  32  --  73*  LABPROT  --  16.7*  --   --   INR  --  1.3*  --   --   HEPARINUNFRC  --  >1.10*  --   --   CREATININE 0.83  --   --  0.74  TROPONINIHS 136* 334* 465* 361*    Estimated Creatinine Clearance: 146.9 mL/min (by C-G formula based on SCr of 0.74 mg/dL).   Medical History: Past Medical History:  Diagnosis Date   DVT (deep venous thrombosis) (HCC) 2016   left leg   Edema    Joint pain    Obesity    Osteoarthritis    Pre-diabetes    SOB (shortness of breath)     Medications:  Xarelto  10 mg daily. Last dose 7/19 at 2 pm - but prior to today has not taken for 2 weeks  Assessment: 48 yo F with hx of DVT/PE on Xarelto  prophylactic dose of 10 mg daily.  Per EDP notes she stopped her xarelto  2 weeks ago for dental cleaning and never resumed it but did take a dose today at 2 pm.  Recent travel to [place]. Pharmacy consulted to dose heparin  for acute PE.  Baseline heparin  level >1.1, Hg & plt WNL  07/18/2024: Initial aPTT 73 sec- therapeutic on IV heparin  2100 units/hr HL 0.99- remains elevated due to DOAC use.  Will continue to use aPTT to monitor until HL correlates with aPTT CBC: Hg, plt WNL No bleeding or infusion related concerns reported by RN  Goal of Therapy:  Heparin  level 0.3-0.7 units/ml aPTT 66-102 seconds Monitor platelets by anticoagulation protocol: Yes   Plan:  Continue heparin  drip at 2100  units/hr  Check confirmatory aPTT at 1000 Daily CBC, heparin  level & aPTT   Catherine Armstrong, PharmD, BCPS 07/18/2024 3:47 AM

## 2024-07-18 NOTE — Progress Notes (Signed)
 Xarelto  given at 1800 per pharmacist Melissa. Patient D/C after as ordered. Patient & family aware to pick up Xarelto  starter pack prescription at [facility] at 0900 tomorrow. Patient measured for TEDS prior to D/C, TEDs & prescription sent with patient. All belongings sent with patient.

## 2024-07-18 NOTE — Discharge Summary (Signed)
 Physician Discharge Summary  ERMAL BRZOZOWSKI FMW:[id number] DOB: 1976-03-24 DOA: 07/17/2024  PCP: Teresa Channel, MD  Admit date: 07/17/2024 Discharge date: 07/18/2024   Discharge Diagnoses:   Principal Problem:   Recurrent PE (pulmonary thromboembolism) (HCC) Active Problems:  Chronic venous stasis   Morbid obesity (HCC)   OSA on CPAP     Hospital Course:  This is a 48 year old female with history of recurrent DVT PE, prediabetes and OSA who presents to the hospital for chest tightness and shortness of breath on exertion.  She states that she stopped taking her Xarelto  about 2 weeks ago for dental procedure and did not restarted afterwards. In the ED noted to have bilateral pulmonary emboli with moderate clot burden, enlargement of the main pulmonary artery and flattening of the interventricular septum.  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 - Chest tightness resolved - Patient is not hypoxic at rest or on exertion today - 2D echo reveals EF of 55 to 60%, grade 1 diastolic dysfunction, interventricular septum flattening in systole consistent with RV pressure overload, moderately enlarged RV, mildly elevated pulmonary artery systolic pressure, mild to moderately dilated right atrium (possibly secondary to sleep apnea) -She has had a prior hematological workup for underlying hypercoagulable state which has been negative-she does not smoke and is not on OCPs-she has been recommended in the past to remain on Xarelto  indefinitely - Appreciate pulmonary consult-no further interventions recommended - Will transition back to Xarelto  and discharged home  Active Problems:     OSA on CPAP -The patient states that she has  a CPAP that was prescribed about a month ago however has not been able to use it regularly as she has had trouble finding a suitable mask    Chronic venous stasis - recommended TEDS   Obesity class III Body mass index is 65.69 kg/m. She states that she has been steadily losing weight          Discharge Instructions   Allergies as of 07/18/2024   No Known Allergies      Medication List              TAKE these medications    Fish Oil 300 MG Caps Take 1 capsule by mouth daily.   Hair Skin & Nails Advanced Tabs Take 1 tablet by mouth daily.   prenatal multivitamin Tabs tablet Take 1 tablet by mouth daily at 12 noon.   Rivaroxaban  Starter Pack (15 mg and 20 mg) Commonly known as: XARELTO  STARTER PACK Follow package directions: Take one 15mg  tablet by mouth twice a day. On day 22, switch to one 20mg  tablet once a day. Take with food. Replaces: Xarelto  10 MG Tabs tablet   tirzepatide  15 MG/0.5ML Pen Commonly known as: MOUNJARO  Inject 15 mg into the skin once a week.   topiramate  25 MG tablet Commonly known as: TOPAMAX  Take 1 tablet (25 mg total) by mouth 2 (two) times daily.   Turmeric 500 MG Tabs Take 1 tablet by mouth daily.   vitamin B-12 100 MCG tablet Commonly known as: CYANOCOBALAMIN Take 100 mcg by mouth daily.            The results of significant diagnostics from this hospitalization (including imaging,  microbiology, ancillary and laboratory) are listed below for reference.    ECHOCARDIOGRAM COMPLETE Result Date: 07/18/2024    ECHOCARDIOGRAM REPORT   Patient Name:   Catherine Armstrong Date of Exam: 07/18/2024 Medical Rec #:  [id number]       Height:       66.0 in Accession #:    [id number]      Weight:       407.0 lb Date of Birth:  27-Jul-1976       BSA:          2.705 m Patient Age:    48 years        BP:           121/78 mmHg Patient Gender: F               HR:           98 bpm. Exam Location:  Inpatient Procedure: 2D Echo, 3D Echo, Cardiac  Doppler, Color Doppler and Strain Analysis            (Both Spectral and Color Flow Doppler were utilized during            procedure). Indications:    Pulmonary Embolus I26.09  History:        Patient has no prior history of Echocardiogram examinations.                 Pulmonary Embolus; Risk Factors:Pre-diabetes and Sleep Apnea.  Sonographer:    Koleen Popper RDCS Referring Phys: [id number] SARA-MAIZ A THOMAS  Sonographer Comments: Global longitudinal strain was attempted. IMPRESSIONS  1. Left ventricular ejection fraction, by estimation, is 55 to 60%. Left ventricular ejection fraction by 3D volume is 57 %. The left ventricle has normal function. The left ventricle has no regional wall motion abnormalities. There is mild concentric left ventricular hypertrophy. Left ventricular diastolic parameters are consistent with Grade I diastolic dysfunction (impaired relaxation). There is the interventricular septum is flattened in systole, consistent with right ventricular pressure overload. The average left ventricular global longitudinal strain is -18.6 %. The global longitudinal strain is normal.  2. Right ventricular systolic function is normal. The right ventricular size is moderately enlarged. There is mildly elevated pulmonary artery systolic pressure. The estimated right ventricular systolic pressure is 37.8 mmHg.  3. Right atrial size was mild to moderately dilated.  4. The mitral valve is normal in structure. Trivial mitral valve regurgitation. No evidence of mitral stenosis.  5. The aortic valve is normal in structure. Aortic valve regurgitation is not visualized. No aortic stenosis is present.  6. The inferior vena cava is dilated in size with >50% respiratory variability, suggesting right atrial pressure of 8 mmHg. Comparison(s): No prior Echocardiogram. Conclusion(s)/Recommendation(s): Overall impression is of chronic cor pulmonale, but cannot exclude a superimposed component of acute cor pulmonale due to  pulmonary embolism. FINDINGS  Left Ventricle: Left ventricular ejection fraction, by estimation, is 55 to 60%. Left ventricular ejection fraction by 3D volume is 57 %. The left ventricle has normal function. The left ventricle has no regional wall motion abnormalities. The average left ventricular global longitudinal strain is -18.6 %. Strain was performed and the global longitudinal strain is normal. The left ventricular internal cavity size was normal in size. There is mild concentric left ventricular hypertrophy. The interventricular septum is flattened in systole, consistent with right ventricular pressure overload. Left ventricular diastolic parameters are consistent with Grade I diastolic dysfunction (impaired relaxation). Normal left ventricular filling pressure. Right Ventricle: The  right ventricular size is moderately enlarged. No increase in right ventricular wall thickness. Right ventricular systolic function is normal. There is mildly elevated pulmonary artery systolic pressure. The tricuspid regurgitant velocity is 2.73 m/s, and with an assumed right atrial pressure of 8 mmHg, the estimated right ventricular systolic pressure is 37.8 mmHg. Left Atrium: Left atrial size was normal in size. Right Atrium: Right atrial size was mild to moderately dilated. Pericardium: There is no evidence of pericardial effusion. Mitral Valve: The mitral valve is normal in structure. Trivial mitral valve regurgitation. No evidence of mitral valve stenosis. Tricuspid Valve: The tricuspid valve is normal in structure. Tricuspid valve regurgitation is trivial. No evidence of tricuspid stenosis. Aortic Valve: The aortic valve is normal in structure. Aortic valve regurgitation is not visualized. No aortic stenosis is present. Pulmonic Valve: The pulmonic valve was normal in structure. Pulmonic valve regurgitation is not visualized. No evidence of pulmonic stenosis. Aorta: The aortic root is normal in size and structure. Venous:  The inferior vena cava is dilated in size with greater than 50% respiratory variability, suggesting right atrial pressure of 8 mmHg. IAS/Shunts: No atrial level shunt detected by color flow Doppler. Additional Comments: 3D was performed not requiring image post processing on an independent workstation and was normal.  LEFT VENTRICLE PLAX 2D LVIDd:         4.80 cm         Diastology LVIDs:         3.00 cm         LV e' medial:    9.90 cm/s LV PW:         1.20 cm         LV E/e' medial:  7.2 LV IVS:        1.10 cm         LV e' lateral:   13.40 cm/s LVOT diam:     2.00 cm         LV E/e' lateral: 5.3 LV SV:         56 LV SV Index:   21              2D Longitudinal LVOT Area:     3.14 cm        Strain                                2D Strain GLS   -18.6 %                                Avg:                                 3D Volume EF                                LV 3D EF:    Left                                             ventricul  ar                                             ejection                                             fraction                                             by 3D                                             volume is                                             57 %.                                 3D Volume EF:                                3D EF:        57 %                                LV EDV:       169 ml                                LV ESV:       72 ml                                LV SV:        97 ml RIGHT VENTRICLE             IVC RV Basal diam:  5.10 cm     IVC diam: 2.90 cm RV Mid diam:    4.20 cm RV S prime:     10.40 cm/s TAPSE (M-mode): 2.3 cm LEFT ATRIUM             Index        RIGHT ATRIUM           Index LA diam:        3.50 cm 1.29 cm/m   RA Area:     18.80 cm LA Vol (A2C):   46.9 ml 17.34 ml/m  RA Volume:   61.20 ml  22.63 ml/m LA Vol (A4C):   51.9 ml 19.19 ml/m LA Biplane Vol: 49.9 ml 18.45 ml/m  AORTIC VALVE LVOT  Vmax:   116.00 cm/s LVOT Vmean:  72.200 cm/s LVOT VTI:    0.178 m  AORTA Ao Root diam:  3.10 cm Ao Asc diam:  3.40 cm MITRAL VALVE               TRICUSPID VALVE MV Area (PHT): 4.54 cm    TR Peak grad:   29.8 mmHg MV Decel Time: 167 msec    TR Vmax:        273.00 cm/s MV E velocity: 71.60 cm/s MV A velocity: 97.90 cm/s  SHUNTS MV E/A ratio:  0.73        Systemic VTI:  0.18 m                            Systemic Diam: 2.00 cm Jerel Croitoru MD Electronically signed by Jerel Balding MD Signature Date/Time: 07/18/2024/11:18:30 AM    Final    CT Angio Chest PE W/Cm &/Or Wo Cm Result Date: 07/17/2024 CLINICAL DATA:  Shortness of breath. EXAM: CT ANGIOGRAPHY CHEST WITH CONTRAST TECHNIQUE: Multidetector CT imaging of the chest was performed using the standard protocol during bolus administration of intravenous contrast. Multiplanar CT image reconstructions and MIPs were obtained to evaluate the vascular anatomy. RADIATION DOSE REDUCTION: This exam was performed according to the departmental dose-optimization program which includes automated exposure control, adjustment of the mA and/or kV according to patient size and/or use of iterative reconstruction technique. CONTRAST:  80mL OMNIPAQUE  IOHEXOL  350 MG/ML SOLN COMPARISON:  Chest x-ray earlier 07/17/2024. Prior CTA chest 03/03/2018. FINDINGS: Cardiovascular: Bilateral pulmonary emboli identified. There are some central emboli along the central right and left pulmonary arteries bilaterally. No saddle embolus. Moderate clot burden. Thrombi are seen scattered along lobar and segmental vessels bilaterally greater in the lower lobes and right upper lobe. Lesser extent along the left upper lobe. Overall moderate clot burden. Heart is slightly enlarged. Slight flattening of the intraventricular septum. No significant reflux of contrast into the intrahepatic IVC. Main pulmonary artery is mildly dilated but similar in diameter to the prior study. Please correlate for clinical  evidence of heart strain. No pericardial effusion. The thoracic aorta has a normal course and caliber. Mediastinum/Nodes: Normal caliber thoracic esophagus. Slightly heterogeneous right thyroid lobe. No specific abnormal lymph node enlargement identified in the axillary regions, hilum or mediastinum. Lungs/Pleura: Diffuse breathing motion. No consolidation, pneumothorax or effusion. Upper Abdomen: The adrenal glands are incompletely included in the imaging field. Portions which are seen are grossly preserved. Replaced left hepatic artery from the left gastric, normal variant. Musculoskeletal: Mild degenerative changes along the spine particularly the lower thoracic spine. Stable slight irregularity along the posterior aspect of right third rib, chronic going back 2018 for adjacent subtle pleural thickening. Review of the MIP images confirms the above findings. IMPRESSION: Bilateral pulmonary emboli including some central areas along the central right and left pulmonary arterial tree. Moderate clot burden. There is some enlargement of the main pulmonary artery but similar to prior study. Slight flattening of the intraventricular septum. Please correlate for any clinical evidence of right heart strain. Critical Value/emergent results were called by telephone at the time of interpretation on 07/17/2024 at 7:33 pm to provider ELSIE BODY , who verbally acknowledged these results. Electronically Signed   By: Ranell Bring M.D.   On: 07/17/2024 19:39   DG Chest Port 1 View Result Date: 07/17/2024 CLINICAL DATA:  Shortness of breath. EXAM: PORTABLE CHEST 1 VIEW COMPARISON:  March 03, 2018 FINDINGS: The heart size and mediastinal contours are within normal limits. Both lungs are clear. The visualized skeletal structures are unremarkable. IMPRESSION: No  active disease. Electronically Signed   By: Suzen Dials M.D.   On: 07/17/2024 18:35   Labs:   Basic Metabolic Panel: Recent Labs  Lab 07/17/24 1753  07/18/24 0234  NA 140 141  K 3.3* 4.1  CL 104 107  CO2 25 24  GLUCOSE 70 103*  BUN 15 16  CREATININE 0.83 0.74  CALCIUM  9.4 9.4     CBC: Recent Labs  Lab 07/17/24 1753 07/18/24 0234  WBC 11.9* 12.0*  NEUTROABS 8.2*  --   HGB 13.6 12.3  HCT 44.1 39.2  MCV 89.8 89.5  PLT 288 233         SIGNED:   True Atlas, MD  [facility] 07/18/2024, 3:18 PM Time taking on discharge: 50 minutes

## 2024-07-18 NOTE — Progress Notes (Signed)
 O2 sat 89-90% when sleeping, Gray placed @ 2L and sat improved to 94-96%.

## 2024-07-19 ENCOUNTER — Other Ambulatory Visit (HOSPITAL_COMMUNITY): Payer: Self-pay

## 2024-07-20 ENCOUNTER — Other Ambulatory Visit (HOSPITAL_BASED_OUTPATIENT_CLINIC_OR_DEPARTMENT_OTHER): Payer: Self-pay

## 2024-07-26 ENCOUNTER — Encounter: Payer: Self-pay | Admitting: Family Medicine

## 2024-07-26 ENCOUNTER — Ambulatory Visit: Admitting: Family Medicine

## 2024-07-26 ENCOUNTER — Telehealth: Payer: Self-pay | Admitting: *Deleted

## 2024-07-26 VITALS — BP 127/79 | HR 79 | Temp 97.8°F | Ht 66.0 in | Wt >= 6400 oz

## 2024-07-26 DIAGNOSIS — E66813 Obesity, class 3: Secondary | ICD-10-CM

## 2024-07-26 DIAGNOSIS — R7303 Prediabetes: Secondary | ICD-10-CM

## 2024-07-26 DIAGNOSIS — G4733 Obstructive sleep apnea (adult) (pediatric): Secondary | ICD-10-CM | POA: Diagnosis not present

## 2024-07-26 DIAGNOSIS — M170 Bilateral primary osteoarthritis of knee: Secondary | ICD-10-CM | POA: Diagnosis not present

## 2024-07-26 DIAGNOSIS — R632 Polyphagia: Secondary | ICD-10-CM | POA: Diagnosis not present

## 2024-07-26 DIAGNOSIS — Z6841 Body Mass Index (BMI) 40.0 and over, adult: Secondary | ICD-10-CM

## 2024-07-26 DIAGNOSIS — I2699 Other pulmonary embolism without acute cor pulmonale: Secondary | ICD-10-CM

## 2024-07-26 MED ORDER — TOPIRAMATE 25 MG PO TABS: 25.0000 mg | ORAL_TABLET | Freq: Two times a day (BID) | ORAL | 0 refills | Status: DC

## 2024-07-26 MED ORDER — TIRZEPATIDE 15 MG/0.5ML ~~LOC~~ SOAJ: 15.0000 mg | SUBCUTANEOUS | 1 refills | Status: AC

## 2024-07-26 NOTE — Telephone Encounter (Signed)
Prior authorization done via cover my meds for patients Mounjaro. Waiting on determination.  

## 2024-07-26 NOTE — Progress Notes (Signed)
 Office: [phone]  /  Fax: [phone]  WEIGHT SUMMARY AND BIOMETRICS  Starting Date: 03/04/24  Starting Weight: 427lb   Weight Lost Since Last Visit: 5lb   Vitals Temp: 97.8 F (36.6 C) BP: 127/79 Pulse Rate: 79 SpO2: 100 %   Body Composition  Body Fat %: 66.5 % Fat Mass (lbs): 269.6 lbs Muscle Mass (lbs): 129 lbs Visceral Fat Rating : 31    HPI  Chief Complaint: OBESITY  Catherine Armstrong is here to discuss her progress with her obesity treatment plan. She is on the the Category 3 Plan and states she is following her eating plan approximately 70 % of the time. She states she is exercising 30-60 minutes 3 times per week.   Interval History:  Since last office visit she is down 5 lb She is down 0 pounds of muscle mass and is down 4.8 pounds of body fat since last visit She has a net weight loss of 22 lb in 4 mos of medically supervised weight management This is a 5.1% total body weight loss She was in the ED 7/19 for bilateral PE after forgetting to take her Xarelto  for ~ 3 weeks Her Xarelto  is managed by her PCP She is getting a chin strap for her CPAP to improve wear She plans to get back on her meal plan after traveling and getting a bit off track She feels ready to increase her exercise frequency as shortness of breath is improving  Pharmacotherapy: Mounjaro  15 mg weekly and Topiramate  25 mg bid   PHYSICAL EXAM:  Blood pressure 127/79, pulse 79, temperature 97.8 F (36.6 C), height 5' 6 (1.676 m), weight (!) 405 lb (183.7 kg), SpO2 100%. Body mass index is 65.37 kg/m.  General: She is overweight, cooperative, alert, well developed, and in no acute distress. PSYCH: Has normal mood, affect and thought process.   Lungs: Normal breathing effort, no conversational dyspnea.   ASSESSMENT AND PLAN  TREATMENT PLAN FOR OBESITY:  Recommended Dietary Goals  Catherine Armstrong is currently in the action stage of change. As such, her goal is to continue weight management  plan. She has agreed to the Category 3 Plan. Copy of category 3 meal plan, 100-calorie snack list and dining out guide given  Behavioral Intervention  We discussed the following Behavioral Modification Strategies today: increasing lean protein intake to established goals, decreasing simple carbohydrates , increasing fiber rich foods, work on meal planning and preparation, keeping healthy foods at home, practice mindfulness eating and understand the difference between hunger signals and cravings, work on managing stress, creating time for self-care and relaxation, avoiding temptations and identifying enticing environmental cues, continue to work on implementation of reduced calorie nutritional plan, continue to practice mindfulness when eating, and continue to work on maintaining a reduced calorie state, getting the recommended amount of protein, incorporating whole foods, making healthy choices, staying well hydrated and practicing mindfulness when eating..  Additional resources provided today: NA  Recommended Physical Activity Goals  Catherine Armstrong has been advised to work up to 150 minutes of moderate intensity aerobic activity a week and strengthening exercises 2-3 times per week for cardiovascular health, weight loss maintenance and preservation of muscle mass.   She has agreed to Think about enjoyable ways to increase daily physical activity and overcoming barriers to exercise and Increase physical activity in their day and reduce sedentary time (increase NEAT).  Pharmacotherapy changes for the treatment of obesity: None  ASSOCIATED CONDITIONS ADDRESSED TODAY  Prediabetes Lab Results  Component Value Date  HGBA1C 5.3 07/17/2024   She has done well with off label use of Mounjaro  15 mg once weekly injection for history of prediabetes and obesity.  She has enjoyed the added satiety without GI side effects.  Continue to work on prescribed diet, adding in more regular physical activity -      Tirzepatide ; Inject 15 mg into the skin once a week.  Dispense: 2 mL; Refill: 1  Polyphagia She is often forgetting to take her second dose of topiramate  25 mg twice daily.  On the days she has remember to take it twice a day, she has enjoyed improvement in food impulse control.  She denies adverse side effects from topiramate .  Will continue topiramate  25 mg twice daily.  Increase intake of fiber with fresh for frozen fruits and vegetables daily. -     Topiramate ; Take 1 tablet (25 mg total) by mouth 2 (two) times daily.  Dispense: 60 tablet; Refill: 0  Class 3 severe obesity due to excess calories with body mass index (BMI) of 60.0 to 69.9 in adult Improving.  Reviewed results from her bioimpedance today  OSA on CPAP She is now wearing a chinstrap for her CPAP machine.  She plans to follow-up with Catherine Armstrong.  Continue active plan for weight reduction.  She did have findings of an moderately enlarged right ventricle on her echocardiogram from 07/18/2024.  Primary osteoarthritis of both knees Stable.  She is able to do some water exercise.  We set a goal for 2 to 3 days a week on a consistent basis.  Continue active plan for weight reduction.  Other acute pulmonary embolism without acute cor pulmonale (HCC) Reviewed patient's hospital notes from 07/18/2024.  She was admitted to the hospital for a repeat PE that occurred after forgetting to take her Xarelto  for about 3 weeks.  She had chest tightness, dyspnea.  She had an echocardiogram and CT chest, reviewed today.  She has had regular follow-up with her PCP and will now remember to take her Xarelto  daily.  Her heart rate and blood pressure are within normal limits.    She was informed of the importance of frequent follow up visits to maximize her success with intensive lifestyle modifications for her multiple health conditions.   ATTESTASTION STATEMENTS:  Reviewed by clinician on day of visit: allergies, medications, problem list, medical  history, surgical history, family history, social history, and previous encounter notes pertinent to obesity diagnosis.   I have personally spent 30 minutes total time today in preparation, patient care, nutritional counseling and education,  and documentation for this visit, including the following: review of most recent clinical lab tests, prescribing medications/ refilling medications, reviewing medical assistant documentation, review and interpretation of bioimpedence results.     Catherine Armstrong, D.O. DABFM, DABOM [facility] [address] [place], [place] [address] [phone]

## 2024-07-28 NOTE — Telephone Encounter (Unsigned)
 Prior authrization denied for patients Mounjaro . Patient needs A1C at least 6.5.

## 2024-08-03 ENCOUNTER — Telehealth: Payer: Self-pay

## 2024-08-03 NOTE — Telephone Encounter (Signed)
 PA submitted through Cover My Meds for Zepbound . Awaiting insurance determination. Key: [id number]

## 2024-08-04 NOTE — Telephone Encounter (Signed)
 The Zepbouund as the OSA indication and the PA should be run with that diagnosis.  Please let me know if this is still pending. Thanks, Dr Waylan

## 2024-08-04 NOTE — Telephone Encounter (Signed)
 PA for Zepbound  was ran with OSA diagnosis and came back as a plan exclusion. Please advise.

## 2024-08-04 NOTE — Telephone Encounter (Signed)
*  Drug Not Covered/Plan Exclusion - Your request for coverage was denied because  your prescription benefit plan does not cover the requested medication.

## 2024-08-24 ENCOUNTER — Ambulatory Visit: Admitting: Family Medicine

## 2024-09-07 ENCOUNTER — Ambulatory Visit: Admitting: Family Medicine

## 2024-09-07 ENCOUNTER — Encounter: Payer: Self-pay | Admitting: Family Medicine

## 2024-09-07 VITALS — BP 119/74 | HR 95 | Temp 98.0°F | Ht 66.0 in | Wt >= 6400 oz

## 2024-09-07 DIAGNOSIS — E1169 Type 2 diabetes mellitus with other specified complication: Secondary | ICD-10-CM

## 2024-09-07 DIAGNOSIS — M170 Bilateral primary osteoarthritis of knee: Secondary | ICD-10-CM

## 2024-09-07 DIAGNOSIS — E66813 Obesity, class 3: Secondary | ICD-10-CM

## 2024-09-07 DIAGNOSIS — R632 Polyphagia: Secondary | ICD-10-CM

## 2024-09-07 DIAGNOSIS — Z6841 Body Mass Index (BMI) 40.0 and over, adult: Secondary | ICD-10-CM

## 2024-09-07 DIAGNOSIS — Z7985 Long-term (current) use of injectable non-insulin antidiabetic drugs: Secondary | ICD-10-CM

## 2024-09-07 MED ORDER — TOPIRAMATE 25 MG PO TABS: 25.0000 mg | ORAL_TABLET | Freq: Two times a day (BID) | ORAL | 0 refills | Status: AC

## 2024-09-07 NOTE — Progress Notes (Signed)
 Office: [phone]  /  Fax: [phone]  WEIGHT SUMMARY AND BIOMETRICS  Starting Date: 03/04/24  Starting Weight: 427lb   Weight Lost Since Last Visit: 4lb   Vitals Temp: 98 F (36.7 C) BP: 119/74 Pulse Rate: 95 SpO2: 99 %   Body Composition  Body Fat %: 66.6 % Fat Mass (lbs): 267.6 lbs Muscle Mass (lbs): 127.4 lbs Visceral Fat Rating : 31     HPI  Chief Complaint: OBESITY  Catherine Armstrong is here to discuss her progress with her obesity treatment plan. She is on the the Category 3 Plan and states she is following her eating plan approximately 40 % of the time. She states she is doing water aerobics for 30 minutes 2 times per week.   Interval History:  Since last office visit she is down 4 lb This gives her a net weight loss of 26 lb in 6 mos of medically supervised weight management She is down 1.6 lb of muscle mass and down 2 lb of body fat since last visit [facility] She is struggling more with meal planning since her son moved out for college She has been lacking intake of fruits, veggies and lean protein She has a 6% TBW loss in 6 mos, using Mounjaro  for hx of T2DM  Pharmacotherapy: Mounjaro  15 mg weekly + Topiramate  25 mg bid  PHYSICAL EXAM:  Blood pressure 119/74, pulse 95, temperature 98 F (36.7 C), height 5' 6 (1.676 m), weight (!) 401 lb (181.9 kg), SpO2 99%. Body mass index is 64.72 kg/m.  General: She is overweight, cooperative, alert, well developed, and in no acute distress. PSYCH: Has normal mood, affect and thought process.   Lungs: Normal breathing effort, no conversational dyspnea.   ASSESSMENT AND PLAN  TREATMENT PLAN FOR OBESITY:  Recommended Dietary Goals  Catherine Armstrong is currently in the action stage of change. As such, her goal is to continue weight management plan. She has agreed to the Category 3 Plan. Copy of cat 3 meal plan and 100 cal snack list provided  Behavioral  Intervention  We discussed the following Behavioral Modification Strategies today: increasing lean protein intake to established goals, increasing fiber rich foods, avoiding skipping meals, increasing water intake , work on meal planning and preparation, keeping healthy foods at home, decreasing eating out or consumption of processed foods, and making healthy choices when eating convenient foods, practice mindfulness eating and understand the difference between hunger signals and cravings, work on managing stress, creating time for self-care and relaxation, avoiding temptations and identifying enticing environmental cues, and continue to work on maintaining a reduced calorie state, getting the recommended amount of protein, incorporating whole foods, making healthy choices, staying well hydrated and practicing mindfulness when eating..  Additional resources provided today: NA  Recommended Physical Activity Goals  Catherine Armstrong has been advised to work up to 150 minutes of moderate intensity aerobic activity a week and strengthening exercises 2-3 times per week for cardiovascular health, weight loss maintenance and preservation of muscle mass.   She has agreed to [employer] strengthening exercises with a goal of 2-3 sessions a week  and Start aerobic activity with a goal of 150 minutes a week at moderate intensity.  [facility] + home workouts 2 days/ wk  Pharmacotherapy changes for the treatment of obesity: none  ASSOCIATED CONDITIONS ADDRESSED TODAY  Polyphagia Food impulse control improved on topiramate  25 mg bid  Chemistry      Component Value Date/Time   NA 141 07/18/2024 0234   K 4.1 07/18/2024 0234   CL 107 07/18/2024 0234   CO2 24 07/18/2024 0234   BUN 16 07/18/2024 0234   CREATININE 0.74 07/18/2024 0234      Component Value Date/Time   CALCIUM  9.4 07/18/2024 0234   ALKPHOS 73 07/18/2024 0234   AST 16 07/18/2024 0234   ALT 11 07/18/2024 0234   BILITOT 0.8  07/18/2024 0234     Tolerating well w/o adverse SE -     Topiramate ; Take 1 tablet (25 mg total) by mouth 2 (two) times daily.  Dispense: 180 tablet; Refill: 0  Class 3 severe obesity due to excess calories with body mass index (BMI) of 60.0 to 69.9 in adult Improving Continue with medically supervised weight managemen Consider the role of bariatric surgery if she fails to progress  Primary osteoarthritis of both knees Bilateral knee DJD continues to limit walking time She [facility] and we set a goal for 3 days/ wk She is actively working on weight loss to improve pain and mobility and for future TKR  Type 2 diabetes mellitus with other specified complication, without long-term current use of insulin  (HCC) Lab Results  Component Value Date   HGBA1C 5.3 07/17/2024   Doing well on Mounjaro  15 mg weekly Denies GI upset but has been more prone to meal skipping  We discussed getting back on cat 3 meal plan, work on meal planning and keeping sugar intake to a minimum.  Ramp up exercise frequency. F/u with PCP for routine diabetes management     She was informed of the importance of frequent follow up visits to maximize her success with intensive lifestyle modifications for her multiple health conditions.   ATTESTASTION STATEMENTS:  Reviewed by clinician on day of visit: allergies, medications, problem list, medical history, surgical history, family history, social history, and previous encounter notes pertinent to obesity diagnosis.   I have personally spent 30 minutes total time today in preparation, patient care, nutritional counseling and education,  and documentation for this visit, including the following: review of most recent clinical lab tests, prescribing medications/ refilling medications, reviewing medical assistant documentation, review and interpretation of bioimpedence results.     Darice Haddock, D.O. DABFM, DABOM [facility] [address] [place], [place] [address] [phone]

## 2024-09-07 NOTE — Patient Instructions (Signed)
 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

## 2024-10-18 ENCOUNTER — Ambulatory Visit: Admitting: Family Medicine
